# Patient Record
Sex: Female | Born: 1955 | Race: White | Hispanic: No | Marital: Married | Smoking: Never smoker
Health system: Southern US, Community
[De-identification: ages and names within clinical notes are randomized; demographics above are authoritative.]

## PROBLEM LIST (undated history)

## (undated) DIAGNOSIS — E7800 Pure hypercholesterolemia, unspecified: Secondary | ICD-10-CM

## (undated) DIAGNOSIS — R519 Headache, unspecified: Secondary | ICD-10-CM

## (undated) DIAGNOSIS — M1990 Unspecified osteoarthritis, unspecified site: Secondary | ICD-10-CM

## (undated) DIAGNOSIS — E039 Hypothyroidism, unspecified: Secondary | ICD-10-CM

## (undated) DIAGNOSIS — E079 Disorder of thyroid, unspecified: Secondary | ICD-10-CM

## (undated) DIAGNOSIS — G4730 Sleep apnea, unspecified: Secondary | ICD-10-CM

## (undated) DIAGNOSIS — I10 Essential (primary) hypertension: Secondary | ICD-10-CM

## (undated) DIAGNOSIS — E119 Type 2 diabetes mellitus without complications: Secondary | ICD-10-CM

## (undated) DIAGNOSIS — J40 Bronchitis, not specified as acute or chronic: Secondary | ICD-10-CM

## (undated) HISTORY — PX: COLONOSCOPY: SHX174

## (undated) HISTORY — PX: BACK SURGERY: SHX140

## (undated) HISTORY — PX: GALLBLADDER SURGERY: SHX652

## (undated) HISTORY — DX: Pure hypercholesterolemia, unspecified: E78.00

## (undated) HISTORY — PX: ABDOMINAL HYSTERECTOMY: SHX81

## (undated) HISTORY — PX: FOOT SURGERY: SHX648

## (undated) HISTORY — DX: Disorder of thyroid, unspecified: E07.9

## (undated) HISTORY — DX: Essential (primary) hypertension: I10

---

## 2000-01-03 ENCOUNTER — Encounter: Payer: Self-pay | Admitting: Orthopedic Surgery

## 2000-01-03 ENCOUNTER — Encounter: Admission: RE | Admit: 2000-01-03 | Discharge: 2000-01-03 | Payer: Self-pay | Admitting: Orthopedic Surgery

## 2000-02-20 ENCOUNTER — Encounter: Payer: Self-pay | Admitting: Neurosurgery

## 2000-02-22 ENCOUNTER — Encounter: Payer: Self-pay | Admitting: Neurosurgery

## 2000-02-22 ENCOUNTER — Ambulatory Visit (HOSPITAL_COMMUNITY): Admission: RE | Admit: 2000-02-22 | Discharge: 2000-02-23 | Payer: Self-pay | Admitting: Neurosurgery

## 2000-08-23 ENCOUNTER — Other Ambulatory Visit: Admission: RE | Admit: 2000-08-23 | Discharge: 2000-08-23 | Payer: Self-pay | Admitting: Obstetrics & Gynecology

## 2001-03-20 HISTORY — PX: BREAST CYST ASPIRATION: SHX578

## 2003-12-22 ENCOUNTER — Ambulatory Visit: Payer: Self-pay | Admitting: Family Medicine

## 2005-03-06 ENCOUNTER — Ambulatory Visit: Payer: Self-pay | Admitting: Family Medicine

## 2005-07-29 ENCOUNTER — Encounter: Admission: RE | Admit: 2005-07-29 | Discharge: 2005-07-29 | Payer: Self-pay | Admitting: Neurosurgery

## 2006-05-01 ENCOUNTER — Ambulatory Visit: Payer: Self-pay | Admitting: Family Medicine

## 2006-05-14 ENCOUNTER — Ambulatory Visit: Payer: Self-pay | Admitting: Family Medicine

## 2006-10-09 ENCOUNTER — Ambulatory Visit: Payer: Self-pay | Admitting: Endocrinology

## 2007-12-25 ENCOUNTER — Ambulatory Visit: Payer: Self-pay | Admitting: Family Medicine

## 2008-10-13 ENCOUNTER — Inpatient Hospital Stay: Payer: Self-pay | Admitting: Vascular Surgery

## 2009-03-04 ENCOUNTER — Ambulatory Visit: Payer: Self-pay | Admitting: Orthopedic Surgery

## 2009-03-11 ENCOUNTER — Ambulatory Visit: Payer: Self-pay | Admitting: Orthopedic Surgery

## 2009-07-14 ENCOUNTER — Ambulatory Visit: Payer: Self-pay | Admitting: Family Medicine

## 2010-08-17 ENCOUNTER — Ambulatory Visit: Payer: Self-pay | Admitting: Obstetrics and Gynecology

## 2010-10-10 ENCOUNTER — Ambulatory Visit: Payer: Self-pay | Admitting: Podiatry

## 2010-10-26 ENCOUNTER — Ambulatory Visit: Payer: Self-pay | Admitting: Gastroenterology

## 2010-12-04 ENCOUNTER — Emergency Department: Payer: Self-pay | Admitting: Emergency Medicine

## 2010-12-11 ENCOUNTER — Encounter: Payer: Self-pay | Admitting: Internal Medicine

## 2010-12-11 ENCOUNTER — Emergency Department (HOSPITAL_COMMUNITY): Payer: Managed Care, Other (non HMO)

## 2010-12-11 ENCOUNTER — Inpatient Hospital Stay (HOSPITAL_COMMUNITY)
Admission: EM | Admit: 2010-12-11 | Discharge: 2010-12-13 | DRG: 195 | Disposition: A | Discharge status: Home / Self Care | Payer: Managed Care, Other (non HMO) | Attending: Internal Medicine | Admitting: Internal Medicine

## 2010-12-11 DIAGNOSIS — E039 Hypothyroidism, unspecified: Secondary | ICD-10-CM | POA: Diagnosis present

## 2010-12-11 DIAGNOSIS — J189 Pneumonia, unspecified organism: Secondary | ICD-10-CM

## 2010-12-11 DIAGNOSIS — I10 Essential (primary) hypertension: Secondary | ICD-10-CM | POA: Diagnosis present

## 2010-12-11 DIAGNOSIS — F329 Major depressive disorder, single episode, unspecified: Secondary | ICD-10-CM | POA: Diagnosis present

## 2010-12-11 DIAGNOSIS — F3289 Other specified depressive episodes: Secondary | ICD-10-CM | POA: Diagnosis present

## 2010-12-11 DIAGNOSIS — K219 Gastro-esophageal reflux disease without esophagitis: Secondary | ICD-10-CM | POA: Diagnosis present

## 2010-12-11 LAB — CBC
HCT: 36.1 % (ref 36.0–46.0)
Hemoglobin: 12.2 g/dL (ref 12.0–15.0)
MCH: 28 pg (ref 26.0–34.0)
MCHC: 33.8 g/dL (ref 30.0–36.0)
MCV: 82.8 fL (ref 78.0–100.0)
Platelets: 295 K/uL (ref 150–400)
RBC: 4.36 MIL/uL (ref 3.87–5.11)
RDW: 13 % (ref 11.5–15.5)
WBC: 11 10*3/uL — ABNORMAL HIGH (ref 4.0–10.5)

## 2010-12-11 LAB — DIFFERENTIAL
Basophils Absolute: 0 10*3/uL (ref 0.0–0.1)
Basophils Relative: 0 % (ref 0–1)
Eosinophils Absolute: 0.1 10*3/uL (ref 0.0–0.7)
Eosinophils Relative: 1 % (ref 0–5)
Lymphocytes Relative: 17 % (ref 12–46)
Lymphs Abs: 1.8 10*3/uL (ref 0.7–4.0)
Monocytes Absolute: 0.6 10*3/uL (ref 0.1–1.0)
Monocytes Relative: 6 % (ref 3–12)
Neutro Abs: 8.4 10*3/uL — ABNORMAL HIGH (ref 1.7–7.7)
Neutrophils Relative %: 77 % (ref 43–77)

## 2010-12-11 LAB — BASIC METABOLIC PANEL WITH GFR
BUN: 13 mg/dL (ref 6–23)
CO2: 22 meq/L (ref 19–32)
Chloride: 103 meq/L (ref 96–112)
Creatinine, Ser: 0.66 mg/dL (ref 0.50–1.10)
Glucose, Bld: 105 mg/dL — ABNORMAL HIGH (ref 70–99)
Potassium: 3.8 meq/L (ref 3.5–5.1)

## 2010-12-11 LAB — POCT I-STAT TROPONIN I: Troponin i, poc: 0 ng/mL (ref 0.00–0.08)

## 2010-12-11 LAB — APTT: aPTT: 32 seconds (ref 24–37)

## 2010-12-11 LAB — PROTIME-INR
INR: 1.03 (ref 0.00–1.49)
Prothrombin Time: 13.7 seconds (ref 11.6–15.2)

## 2010-12-11 LAB — BASIC METABOLIC PANEL
Calcium: 9.8 mg/dL (ref 8.4–10.5)
GFR calc Af Amer: >60 mL/min (ref >60)
GFR calc non Af Amer: >60 mL/min (ref >60)
Sodium: 139 mEq/L (ref 135–145)

## 2010-12-11 LAB — D-DIMER, QUANTITATIVE: D-Dimer, Quant: 1.11 ug/mL-FEU — ABNORMAL HIGH (ref 0.00–0.48)

## 2010-12-11 NOTE — H&P (Signed)
Hospital Admission Note Date: 12/11/2010  Patient name: [name] Medical record number: [id number] Date of birth: 08-20-1955 Age: 55 y.o. Gender: female PCP: Gwynneth Albright, MD  Medical Service:  General Medicine Teaching B2  Attending physician:  Dr. Anderson Malta    Resident [id number]):  Dr. Carrolyn Meiers   Pager:  [phone] Resident (R1):  Dr. Genella Mech   Pager:  [phone]  Chief Complaint: Shortness of Breath  History of Present Illness: [name] is a 55 year old woman with history HTN, HLD, and recurrent bronchitis who underwent ankle surgery nine days ago who presents with shortness of breath.  She first had shortness of breath two days after her surgery.  She has concurrent chest pain worse with inspiration or exertion, no association with eating or position.    She presented to [facility] seven days ago for this SOB with concurrent chest pain.  There, she underwent workup that included negative cardiac enzymes, BNP 394, negative urinalysis, normal CXRay, and CT with contrast negative for PE and clear lungs.  Following discharge from the ED there, her shortness of breath and chest pain mildly improved but both persisted.    Yesterday, her shortness of breath became worse, and this is why she came to the hospital today.  She has been coughing up thick sputum, at first white but becoming yellow during her trip to the ED.  Her chest pain is similar to that described above, has the quality of "a pressure", is located centrally, and is non-radiating.  She reports recent fevers to 101F at home, but she has been afebrile here in the ED.  She has had night sweats as well.  At baseline, she tires before walking one block, but now she tires after walking less than 10 feet.  She has been walking with a walker some since surgery, but mostly she used a wheelchair since she gets so tired.  No leg pain, leg erythema, rash, headache, abdominal pain, diarrhea, dysuria, or  hematuria.  She has had some constipation but had a BM today and has constipation at baseline.      Meds: FIBERCON OTC, PO, Dose: 2 CAP, QAM LEVOTHROID (LEVOTHYROXINE) 137 MCG, PO, Dose: 1 TAB, DAILY  LISINOPRIL 10 MG, PO, Dose: 1 TAB, DAILY  NEXIUM (ESOMEPRAZOLE) 20 MG, PO, Dose: 1 CAP, DAILY  PRAMIPEXOLE 0.125MG  TAB, PO, Dose: 1 - 2 TAB, QHS  PROMETHAZINE 25 MG, PO, Dose: 1 TAB, Q8H, PRN   Allergies: NKDA   Past Medical History: HTN HLD Hypothyroidism Recurrent Bronchitis Depression   Past Surgical History: Recent Ankle Surgery Cholecystectomy Lumbar Back Surgery Hysterectomy C-Section   Family History: CAD    Social History: Never smoker.  Occasional alcohol use.  No drug use. At baseline, gets out of breath walking one block.   Review of Systems: See HPI   Physical Exam: Vitals: T 98.2, R 100, BP 138/66, R 17, SiO2 93-100% on 2L O2.   General: alert, well-developed, and cooperative to examination.  Head: normocephalic and atraumatic.  Eyes: vision grossly intact, pupils equal, pupils round, pupils reactive to light, no injection and anicteric.  Mouth: pharynx pink and moist, no erythema, and no exudates.  Neck: no JVD, and no carotid bruits.   Lungs: Mild crackles L base, normal breath sounds over R lung fields.  Normal respiratory effort, no accessory muscle use.  Heart: fast rate, regular rhythm, no murmur, no gallop, and no rub.  Abdomen: soft, non-tender, normal bowel sounds, no distention, no guarding, no  rebound tenderness. Msk: no joint swelling, no joint warmth, and no redness over joints.  Pulses: 2+ DP/PT pulses on R, L not palpated due to cast in place.  L toes well perfused.  Extremities: No cyanosis, clubbing, edema.  Cast in place over distal LLE.  No tenderness in R foot, calf, or thigh.  Tenderness to palpation in L popliteal region/upper calf.  No associated erythema, warmth, or swelling.   Neurologic: alert & oriented X3, cranial  nerves II-XII intact, strength normal in upper extremities, lower extremity strength not tested, sensation intact to light touch. Skin: turgor normal and no rashes.  Psych: Oriented X3, memory intact for recent and remote, normally interactive, good eye contact, not anxious appearing, and not depressed appearing.    Lab results: Basic Metabolic Panel:  Basename 12/11/10 1709  NA 139  K 3.8  CL 103  CO2 22  GLUCOSE 105*  BUN 13  CREATININE 0.66  CALCIUM 9.8  MG --  PHOS --   CBC:  Basename 12/11/10 1709  WBC 11.0*  NEUTROABS 8.4*  HGB 12.2  HCT 36.1  MCV 82.8  PLT 295    Troponin I, POC                          0.00              0.00-0.08        Ng/mL  Protime ( Prothrombin Time)              13.7              11.6-15.2        seconds  INR                                      1.03              0.00-1.49  PTT(a-Partial Thromboplastn Time)        32                24-37            seconds   Imaging results:  Dg Chest 2 View  12/11/2010  *RADIOLOGY REPORT*  Clinical Data: Shortness of breath, chest pain, fever and cough.  CHEST - 2 VIEW  Comparison: None.  Findings: Trachea is midline.  Heart size normal.  There is focal airspace consolidation in the right perihilar region.  Minimal bibasilar atelectasis. Tiny bilateral pleural effusions.  IMPRESSION:  1.  Focal airspace consolidation in the right perihilar region may be due to pneumonia.  Mass cannot be excluded.  Follow-up to clearing is recommended. 2.  Tiny bilateral pleural effusions. 3.  Bibasilar atelectasis.  Original Report Authenticated By: Reyes Ivan, M.D.    Other results: EKG: Rate 104, sinus rhythm.  QRS morphology abnormalities in III, aVL, V1, V2, V3, mostly new from previous 2001 EKG.  Q wave in III.  T wave inversion in lead III, present in 2001 as well.     Assessment & Plan by Problem:  Hospital Acquired Pneumonia: Given this patient's clinical picture of productive sputum, fever, elevated WBC  count and chest xray findings of right perihilar region consolidation PNA, this is most likely the reason for his symptoms. the patient is saturating > 93% on oxygen on 2L/[place].  Plan -Admit to Telemetry.  -Check 2 sets blood  cultures and check legionella antigen. -Start IV antibiotics for hospital acquired pneumonia with Vancomycin and Zosyn given recent ankle surgery 10 days ago.   Chest Pain: Patient presents with pleuritic chest pain, which has been ongoing for 6 days, worse with breathing, patient has not had similar symptoms in the past, has no history of CAD. Note that the patient has has left ankle surgery 10 days ago. Pt has no prior cath or echo on file. EKG today showed: incomplete right bundle branch block with no signs of acute MI, no new q waves detected. Given her presentation her chest pain is most likely due to PNA/MSK but may also be due to PE given recent surgery (wells score = 3 - Mod risk), and less likely ACS or GERD. Plan: -Will admit to TELE -Cycle CE x3 if Positive, consider anticoagulation vs catheterisation. -Check 2D Echo to r/o wall motion abnormalities. -Will consider cardiology consult if Troponins are positive. -Check D-Dimer, if positive consider CTA to ruleout PE.  -Risk stratify by checking AM fasting lipids, and controlling blood pressure with Lisinopril.   Hypertension: Well controlled. Patient is on Lisinopril at home. Will continue her home med and continue to monitor.  GERD: Will provide protonix while hospitalized and provide any OTC PPI as an outpatient.   DVT Prophylaxis: Lovenox       R2/3______________________________      R1________________________________  ATTENDING: I performed and/or observed a history and physical examination of the patient.  I discussed the case with the residents as noted and reviewed the residents' notes.  I agree with the findings and plan--please refer to the attending physician note for more  details.  Signature________________________________  Printed Name_____________________________

## 2010-12-12 LAB — CBC
HCT: 32 % — ABNORMAL LOW (ref 36.0–46.0)
MCHC: 33.8 g/dL (ref 30.0–36.0)
MCV: 82.9 fL (ref 78.0–100.0)
RDW: 13.4 % (ref 11.5–15.5)
WBC: 8.6 10*3/uL (ref 4.0–10.5)

## 2010-12-12 LAB — TROPONIN I: Troponin I: <0.3 ng/mL (ref <0.30)

## 2010-12-12 LAB — CARDIAC PANEL(CRET KIN+CKTOT+MB+TROPI)
Relative Index: INVALID (ref 0.0–2.5)
Relative Index: INVALID (ref 0.0–2.5)
Troponin I: <0.3 ng/mL (ref <0.30)

## 2010-12-12 LAB — LIPID PANEL
HDL: 39 mg/dL — ABNORMAL LOW (ref >39)
Total CHOL/HDL Ratio: 4.1 RATIO

## 2010-12-12 LAB — TSH: TSH: 0.821 u[IU]/mL (ref 0.350–4.500)

## 2010-12-12 LAB — BASIC METABOLIC PANEL
BUN: 14 mg/dL (ref 6–23)
Chloride: 100 mEq/L (ref 96–112)
Creatinine, Ser: 0.72 mg/dL (ref 0.50–1.10)
GFR calc Af Amer: >60 mL/min (ref >60)

## 2010-12-12 LAB — CK TOTAL AND CKMB (NOT AT ARMC)
CK, MB: 1.8 ng/mL (ref 0.3–4.0)
Relative Index: INVALID (ref 0.0–2.5)
Total CK: 61 U/L (ref 7–177)

## 2010-12-12 MED ORDER — IOHEXOL 300 MG/ML  SOLN
100.0000 mL | Freq: Once | INTRAMUSCULAR | Status: AC | PRN
  Administered 2010-12-12: 100 mL via INTRAVENOUS

## 2010-12-13 DIAGNOSIS — J189 Pneumonia, unspecified organism: Secondary | ICD-10-CM

## 2010-12-13 LAB — LEGIONELLA ANTIGEN, URINE: Legionella Antigen, Urine: NEGATIVE

## 2010-12-18 LAB — CULTURE, BLOOD (ROUTINE X 2)
Culture  Setup Time: 201209241021
Culture  Setup Time: 201209241021
Culture: NO GROWTH
Culture: NO GROWTH

## 2010-12-26 NOTE — Discharge Summary (Signed)
NAME:  [name], [name] NO.:  [id number]  MEDICAL RECORD NO.:  [id number]  LOCATION:  [facility]                         FACILITY:  [facility]  PHYSICIAN:  Dr. Phillips Odor            DATE OF BIRTH:  [birth year]  DATE OF ADMISSION:  12/11/2010 DATE OF DISCHARGE:  12/13/2010                              DISCHARGE SUMMARY   PRIMARY CARE DOCTOR:  Dr. Jerl Mina.  Their office number is [phone].  Their fax number is [phone].  DISCHARGE DIAGNOSES: 1. Healthcare-acquired pneumonia. 2. Hypertension. 3. Hypothyroidism. 4. Gastroesophageal reflux disease.  DISCHARGE MEDICATIONS: 1. Azithromycin 500 mg one tablet by mouth daily for 12 days, starting     day of discharge. 2. FiberCon OTC two tablets by mouth every evening. 3. Levothyroxine 137 mcg one tablet by mouth daily. 4. Lisinopril 10 mg one tablet by mouth daily. 5. Nexium 20 mg one capsule by mouth daily. 6. Pramipexole 0.125 mg one to two tablets by mouth daily at bedtime. 7. Promethazine 25 mg as needed for nausea one tablet every 8 hours.  DISPOSITION AND FOLLOWUP:  The patient will be seen with her PCP, Dr. Jerl Mina on October 1 at 11 o'clock in the morning.  At this time, please check on resolution of her symptoms as well as listen to her lungs and make sure that they are clear, and please continue to care for her other medical problems.  PROCEDURES PERFORMED:  None.  CONSULTATIONS:  None.  BRIEF ADMITTING HISTORY AND PHYSICAL:  This is a 55 year old woman with history of hypertension, hyperlipidemia, recurrent bronchitis, who underwent ankle surgery for an Achilles tendon 9 days ago, presents with shortness of breath, had shortness of breath 2 days after surgery, recurrent chest pain worse with inspiration or exertion.  No association with eating or position.  Presented to [facility] 7 days ago for shortness of breath with chest pain.  There she went workup that include negative cardiac  enzymes, BNP of 394, negative urinalysis, normal chest x-ray, and CT with contrast negative for PE and did show clear lungs. Following discharge there, her shortness of breath and chest pain improved, but did persist.  Yesterday, her shortness of breath became worse and she was coughing up 6 sputum at first white turning to yellow. Her chest pain similar as described above has the quality of pressure located centrally as nonradiating.  She does report fevers at home of 101 degrees Fahrenheit, but has been afebrile in the emergency department.  She has had night sweats at home at baseline.  She tired before walking one block now.  She tired after walking 10 feet.  She has been walking with walker since surgery, but mostly uses a wheelchair when she has been very tired.  She does not have any leg pain, erythema, rash, headache, abdominal pain, diarrhea, dysuria, or hematuria.  She has had some constipation.  The last bowel movement was today and have constipation at baseline.  HOME MEDICATIONS: 1. FiberCon OTC two tablets every morning by mouth. 2. Levothyroxine 137 mcg one tablet daily by mouth. 3. Lisinopril 10 mg p.o. one tablet daily. 4. Nexium 20 mg p.o. one capsule  daily. 5. Pramipexole 0.125 mg capsule by mouth one to two tablets as needed     at bedtime. 6. Promethazine 25 mg one tablet as needed every 8 hours orally for     nausea.  ALLERGIES:  None.  PAST MEDICAL HISTORY:  Hypertension, hyperlipidemia, hypothyroidism, recurrent bronchitis, and depression.  Recent surgical history, that is recent ankle surgery, cholecystectomy, lumbar back surgery, hysterectomy and oophorectomy.  PHYSICAL EXAMINATION:  VITAL SIGNS:  On the day of exam, temperature 98.2, respiration 17, pulse 100, blood pressure 138/66, saturating 93- 100% on 2 liters of oxygen. GENERAL:  Awake, alert, well developed, cooperative to exam, normocephalic, atraumatic. EYES:  Vision grossly intact.  Pupils are  equal, round, reactive to light, and accommodation.  Extraocular motions intact.  Anicteric. NECK:  No JVD.  No carotid bruits. LUNGS:  Mild crackles at the left base.  Normal breath sounds over the right lung fields.  Normal respiratory effort.  No accessory muscle use. HEART:  Fast rate, regular rhythm.  No murmurs, rubs, or gallops. ABDOMEN:  Soft, nontender, normal bowel sounds.  No distention.  No guarding.  No rebound tenderness. MUSCULOSKELETAL:  No joint swelling.  No joint warmth.  No redness over the joint.  Pulses 2+ dorsalis pedis and posterior tibial pulses on the right, left not palpated due to cath.  Left toe is well perfused. EXTREMITIES:  No cyanosis, clubbing, or edema.  Cath in place over distal left lower extremity.  No tenderness in right foot, calf, or thigh.  Tenderness to palpation in the left popliteal region of her calf.  No associated erythema, warmth, or swelling. NEUROLOGIC:  Awake, alert, and oriented x3.  Cranial nerves II through XII intact.  Strength normal in upper extremities and lower extremity. Strength not tested.  Sensation intact to light touch. SKIN:  Turgor normal.  No rashes. PSYCH:  Oriented x3, memory intact to remote and recent, normally interactive, good eye contact, not anxious appearing, not depressed appearing.  LABORATORY RESULTS:  On the day of admission, basic metabolic panel showing sodium 161, potassium 3.8, chloride 103, bicarb 22, BUN 13, creatinine 0.66, glucose 105, calcium 9.8.  CBC showing white count of 11.0, hemoglobin 12.2, platelets 295.  Point-of-care troponin was 0.00. PT/INR was normal.  PTT was normal.  There was a diagnostic chest x-ray that did show focal air space consolidation in right perihilar region may be due to pneumonia, mass cannot be excluded, follow up to show clearing as recommendation, tiny bilateral pleural effusions, bibasilar atelectasis.  There was also an EKG that was done that did show a rate of  104 sinus rhythm, QRS morphology abnormalities, 3 AVL, V1, V2, V3, mostly new from previous 2001.  EKG, Q-wave inversion in lead III present in 2001.  HOSPITAL COURSE BY PROBLEM: 1. Healthcare-acquired pneumonia given the patient's productive     sputum, fever, elevated white count, chest x-ray findings, this was     felt to be leading diagnosis.  The patient did have recent workup     that was negative for PE however.  An additional workup for chest     pain related ACS and PE was done that was negative via three     cardiac enzymes and another CT of the chest did confirm a right     perihilar pneumonia.  She was admitted to telemetry.  We did check     blood cultures.  We checked a Legionella strep influenza antigen.     Please start IV antibiotics  and we did get a repeat EKG that did     show no changes from baseline EKG on admission.  We did get a 2-D     echo and we did check a D-dimer which was positive which did lead     [place] to get the CT of the chest which was negative for PE.  She was     much improved at the time of discharge on IV antibiotics and was     switched to oral antibiotics at the time of discharge.  She was     felt to be stable to go home.  Her breathing was improved.  Her     functional status was approximately the same as her baseline since     the surgery.  She will need recuperation from the surgery in the     future.  Otherwise, stable to go home. 2. Hypertension.  She was well controlled during this hospitalization.     She is on lisinopril at home which was continued and did control     her blood pressure adequately during this hospitalization. 3. Hypotension.  We did check a TSH and free T4 levels which were both     normal and therefore we did continue her home dose of Synthroid and     we would make no changes at this time.  The patient was stable at     the time of discharge. 4. GERD.  We did provide Protonix while she was hospitalized and we     will  continue her Nexium as an outpatient.  No flares with this     during this hospitalization.  At this point, the patient was stable to be discharged from the hospital and was discharged home safely with an oral course of antibiotics and follow up with her primary care physician.  Additional vitals on the day of discharge are temperature was 98.2, pulse 82, respirations 22, blood pressure 124/77, saturating 96% on 2 liters.  She did have additional imaging that was done during this admission.  She did have a CT angio of the chest, which did show multifocal bilateral ground-glass opacities in the lungs.  Primary considerations include multifocal pneumonia, possibly fat embolism given history of recent orthopedic surgery, however, clinical correlation is recommended.  Small bilateral pleural effusions.  No evidence of acute traumatic pulmonary embolism, small hiatal hernia, several slightly prominent mediastinal lymph nodes of uncertain chronicity.  There was additionally an echo done that did show the left ventricle cavity size was normal, mild focal basal hypertrophy of the septum, systolic function rigorous, estimated ejection fraction 65-70%.  Wall motion normal.  No regional wall motion abnormalities. Doppler parameters were consistent with abnormal left ventricular relaxation, graded 1 of diastolic dysfunction, mitral valve had moderate regurgitation.  Left atrium mildly dilated.  Right ventricle mildly dilated.  Wall thickness was normal.  Right atrium was mildly dilated. Pulmonary artery systolic pressure was moderately increased.  PA peak pressure of 49 mmHg.  We did not have any historical echo to concern these findings to, however, there was a Legionella done that was negative.  There were two blood cultures drawn that did show no growth. Three cardiac enzymes that were all negative.  A TSH that was 0.82 which is normal and T4 free which was 1.25 which is normal.  A lipid  profile did show a total cholesterol of 159, triglyceride of 201, HDL of 39, LDL of 80.  A basic metabolic panel  on the day of discharge did show sodium 136, potassium 4.3, chloride 100, bicarb 28, BUN 14, creatinine 0.72, glucose 119, calcium 9.1.  CBC that did show white count of 8.6, hemoglobin 10.8, platelet count 265, and these were the only other laboratory results or radiology findings during this hospitalization. At this time, the patient was stable in regard to discharge.    ______________________________ Genella Mech, MD   ______________________________ Dr. Phillips Odor    EK/MEDQ  D:  12/13/2010  T:  12/13/2010  Job:  [id number]  cc:   Jerl Mina, MD  Electronically Signed by Genella Mech MD on 12/20/2010 07:23:15 PM Electronically Signed by Anderson Malta D.O. on 12/26/2010 04:56:30 PM

## 2012-06-24 ENCOUNTER — Ambulatory Visit: Payer: Self-pay | Admitting: Orthopedic Surgery

## 2013-03-14 ENCOUNTER — Encounter: Payer: Self-pay | Admitting: Podiatry

## 2013-03-14 ENCOUNTER — Ambulatory Visit (INDEPENDENT_AMBULATORY_CARE_PROVIDER_SITE_OTHER): Payer: BC Managed Care – PPO

## 2013-03-14 ENCOUNTER — Ambulatory Visit (INDEPENDENT_AMBULATORY_CARE_PROVIDER_SITE_OTHER): Payer: BC Managed Care – PPO | Admitting: Podiatry

## 2013-03-14 VITALS — BP 157/83 | HR 89 | Resp 16 | Ht 60.0 in | Wt 240.0 lb

## 2013-03-14 DIAGNOSIS — M79671 Pain in right foot: Secondary | ICD-10-CM

## 2013-03-14 DIAGNOSIS — M7731 Calcaneal spur, right foot: Secondary | ICD-10-CM

## 2013-03-14 DIAGNOSIS — M79609 Pain in unspecified limb: Secondary | ICD-10-CM

## 2013-03-14 DIAGNOSIS — M7660 Achilles tendinitis, unspecified leg: Secondary | ICD-10-CM

## 2013-03-14 DIAGNOSIS — M7730 Calcaneal spur, unspecified foot: Secondary | ICD-10-CM

## 2013-03-14 MED ORDER — TRIAMCINOLONE ACETONIDE 10 MG/ML IJ SUSP
10.0000 mg | Freq: Once | INTRAMUSCULAR | Status: AC
  Administered 2013-03-14: 10 mg

## 2013-03-14 MED ORDER — DICLOFENAC SODIUM 75 MG PO TBEC: 75.0000 mg | DELAYED_RELEASE_TABLET | Freq: Two times a day (BID) | ORAL | Status: DC

## 2013-03-14 NOTE — Progress Notes (Signed)
Subjective:     Patient ID: [name], female   DOB: Jan 19, 1956, 57 y.o.   MRN: [id number]  HPI patient states that she has been having trouble with her right Achilles tendon for several months. Had surgery by Dr. Charmian Muff on her left Achilles tendon approximately 2-1/2 years ago and that is doing well. Pain is worse with activity or prolonged standing   Review of Systems     Objective:   Physical Exam Neurovascular status intact with mild equinus condition noted of both feet. I noted there to be a well-healed incision site posterior aspect left heel and discomfort in the medial and central Achilles tendon at its insertion into the calcaneus right    Assessment:     Achilles tendinitis right with spur formation noted on x-ray    Plan:     We are going to try to initiate conservative care but it is possible that surgery will be necessary due to the fact the left one did not respond conservatively. I did a careful injection 3 mg Kenalog 5 of Xylocaine Marcaine mixture into the medial aspect of the Achilles first explaining to her the possibilities of rupture associated with injection treatment. Patient then had air fracture walker dispensed with instructions on usage and was given information on stretching exercises. Reappoint 3 weeks for Dr. Al Corpus to evaluate and also was placed on Voltaren 75 mg twice a day

## 2013-03-14 NOTE — Patient Instructions (Signed)

## 2013-03-31 ENCOUNTER — Ambulatory Visit: Payer: Self-pay | Admitting: Family Medicine

## 2013-04-03 ENCOUNTER — Ambulatory Visit: Payer: BC Managed Care – PPO | Admitting: Podiatry

## 2013-04-09 ENCOUNTER — Encounter: Payer: Self-pay | Admitting: Podiatry

## 2013-04-09 ENCOUNTER — Ambulatory Visit (INDEPENDENT_AMBULATORY_CARE_PROVIDER_SITE_OTHER): Payer: BC Managed Care – PPO | Admitting: Podiatry

## 2013-04-09 VITALS — BP 118/83 | HR 95 | Resp 16

## 2013-04-09 DIAGNOSIS — M7660 Achilles tendinitis, unspecified leg: Secondary | ICD-10-CM

## 2013-04-09 MED ORDER — DICLOFENAC SODIUM 75 MG PO TBEC: 75.0000 mg | DELAYED_RELEASE_TABLET | Freq: Two times a day (BID) | ORAL | Status: DC

## 2013-04-09 NOTE — Progress Notes (Signed)
Follow up of achilles right  Foot, it was better until last Friday when i got up from my desk. I also stopped where my Cam Walker 2 weeks ago. She ran out of her diclofenac and has not had a refill.  Objective: Vital signs are stable she is alert and oriented x3. She has mild edema to the posterior aspect of the right foot as the Achilles tendon attaches to the calcaneus. Very large nonpulsatile mass confirmed by radiograph as a retrocalcaneal heel spur. There is warm to the touch.  Assessment: Pain in limb secondary to tendo Achilles tendinitis.  Plan: Injected the posterior aspect of the right Achilles area today subcutaneously with 2 mg of dexamethasone and local anesthetic she's to continue to ice this continue the use of the diclofenac and get back into her cam walker. I will followup with her in 2-4 weeks consider MRI if no better.

## 2013-04-20 ENCOUNTER — Ambulatory Visit: Payer: Self-pay | Admitting: Family Medicine

## 2013-05-07 ENCOUNTER — Ambulatory Visit (INDEPENDENT_AMBULATORY_CARE_PROVIDER_SITE_OTHER): Payer: BC Managed Care – PPO | Admitting: Podiatry

## 2013-05-07 VITALS — BP 135/73 | HR 94 | Resp 16 | Ht 60.0 in | Wt 223.0 lb

## 2013-05-07 DIAGNOSIS — M7660 Achilles tendinitis, unspecified leg: Secondary | ICD-10-CM

## 2013-05-07 NOTE — Progress Notes (Signed)
She presents today for followup of her insertional Achilles tendinitis of her right foot. She states it is really not getting any better.  Objective: Vital signs are stable she is alert and oriented x3. There is pain on palpation at the insertion site of the tendo Achilles of the right heel. There is warmth on palpation to this area. She has pain with sharp dorsiflexion of the foot with the knee held straight.  Assessment: Can't rule out a partial split tear at the insertion site of the Achilles medially.  Plan: Discussed etiology pathology conservative versus surgical therapies at this point I suggested an MRI which she declined. I also suggested physical therapy and she declined this as well. I did write a prescription for physical therapy and case she decides she would like to attempt that. I will followup with her on an as-needed basis.

## 2013-05-18 ENCOUNTER — Ambulatory Visit: Payer: Self-pay | Admitting: Family Medicine

## 2014-04-29 ENCOUNTER — Ambulatory Visit: Payer: Self-pay | Admitting: Family Medicine

## 2015-04-02 ENCOUNTER — Emergency Department: Payer: Worker's Compensation

## 2015-04-02 ENCOUNTER — Encounter: Payer: Self-pay | Admitting: Emergency Medicine

## 2015-04-02 ENCOUNTER — Emergency Department
Admission: EM | Admit: 2015-04-02 | Discharge: 2015-04-02 | Disposition: A | Discharge status: Home / Self Care | Payer: Worker's Compensation | Attending: Emergency Medicine | Admitting: Emergency Medicine

## 2015-04-02 DIAGNOSIS — S61303A Unspecified open wound of left middle finger with damage to nail, initial encounter: Secondary | ICD-10-CM | POA: Insufficient documentation

## 2015-04-02 DIAGNOSIS — Z791 Long term (current) use of non-steroidal anti-inflammatories (NSAID): Secondary | ICD-10-CM | POA: Insufficient documentation

## 2015-04-02 DIAGNOSIS — S62633A Displaced fracture of distal phalanx of left middle finger, initial encounter for closed fracture: Secondary | ICD-10-CM

## 2015-04-02 DIAGNOSIS — S61309A Unspecified open wound of unspecified finger with damage to nail, initial encounter: Secondary | ICD-10-CM

## 2015-04-02 DIAGNOSIS — Y9289 Other specified places as the place of occurrence of the external cause: Secondary | ICD-10-CM | POA: Diagnosis not present

## 2015-04-02 DIAGNOSIS — Y9389 Activity, other specified: Secondary | ICD-10-CM | POA: Insufficient documentation

## 2015-04-02 DIAGNOSIS — S61313A Laceration without foreign body of left middle finger with damage to nail, initial encounter: Secondary | ICD-10-CM | POA: Insufficient documentation

## 2015-04-02 DIAGNOSIS — Y990 Civilian activity done for income or pay: Secondary | ICD-10-CM | POA: Insufficient documentation

## 2015-04-02 DIAGNOSIS — Z79899 Other long term (current) drug therapy: Secondary | ICD-10-CM | POA: Insufficient documentation

## 2015-04-02 DIAGNOSIS — W231XXA Caught, crushed, jammed, or pinched between stationary objects, initial encounter: Secondary | ICD-10-CM | POA: Insufficient documentation

## 2015-04-02 DIAGNOSIS — S6992XA Unspecified injury of left wrist, hand and finger(s), initial encounter: Secondary | ICD-10-CM | POA: Diagnosis present

## 2015-04-02 MED ORDER — OXYCODONE-ACETAMINOPHEN 5-325 MG PO TABS: 1.0000 | ORAL_TABLET | Freq: Four times a day (QID) | ORAL | Status: DC | PRN

## 2015-04-02 MED ORDER — DIAZEPAM 5 MG PO TABS
5.0000 mg | ORAL_TABLET | Freq: Once | ORAL | Status: AC
  Administered 2015-04-02: 5 mg via ORAL
  Filled 2015-04-02: qty 1

## 2015-04-02 MED ORDER — LIDOCAINE HCL (PF) 1 % IJ SOLN
10.0000 mL | Freq: Once | INTRAMUSCULAR | Status: DC
  Filled 2015-04-02: qty 10

## 2015-04-02 NOTE — Discharge Instructions (Signed)
Cast or Splint Care °Casts and splints support injured limbs and keep bones from moving while they heal. It is important to care for your cast or splint at home.   °HOME CARE INSTRUCTIONS °· Keep the cast or splint uncovered during the drying period. It can take 24 to 48 hours to dry if it is made of plaster. A fiberglass cast will dry in less than 1 hour. °· Do not rest the cast on anything harder than a pillow for the first 24 hours. °· Do not put weight on your injured limb or apply pressure to the cast until your health care provider gives you permission. °· Keep the cast or splint dry. Wet casts or splints can lose their shape and may not support the limb as well. A wet cast that has lost its shape can also create harmful pressure on your skin when it dries. Also, wet skin can become infected. °· Cover the cast or splint with a plastic bag when bathing or when out in the rain or snow. If the cast is on the trunk of the body, take sponge baths until the cast is removed. °· If your cast does become wet, dry it with a towel or a blow dryer on the cool setting only. °· Keep your cast or splint clean. Soiled casts may be wiped with a moistened cloth. °· Do not place any hard or soft foreign objects under your cast or splint, such as cotton, toilet paper, lotion, or powder. °· Do not try to scratch the skin under the cast with any object. The object could get stuck inside the cast. Also, scratching could lead to an infection. If itching is a problem, use a blow dryer on a cool setting to relieve discomfort. °· Do not trim or cut your cast or remove padding from inside of it. °· Exercise all joints next to the injury that are not immobilized by the cast or splint. For example, if you have a long leg cast, exercise the hip joint and toes. If you have an arm cast or splint, exercise the shoulder, elbow, thumb, and fingers. °· Elevate your injured arm or leg on 1 or 2 pillows for the first 1 to 3 days to decrease  swelling and pain. It is best if you can comfortably elevate your cast so it is higher than your heart. °SEEK MEDICAL CARE IF:  °· Your cast or splint cracks. °· Your cast or splint is too tight or too loose. °· You have unbearable itching inside the cast. °· Your cast becomes wet or develops a soft spot or area. °· You have a bad smell coming from inside your cast. °· You get an object stuck under your cast. °· Your skin around the cast becomes red or raw. °· You have new pain or worsening pain after the cast has been applied. °SEEK IMMEDIATE MEDICAL CARE IF:  °· You have fluid leaking through the cast. °· You are unable to move your fingers or toes. °· You have discolored (blue or white), cool, painful, or very swollen fingers or toes beyond the cast. °· You have tingling or numbness around the injured area. °· You have severe pain or pressure under the cast. °· You have any difficulty with your breathing or have shortness of breath. °· You have chest pain. °  °This information is not intended to replace advice given to you by your health care provider. Make sure you discuss any questions you have with your health care   provider.   Document Released: 03/03/2000 Document Revised: 12/25/2012 Document Reviewed: 09/12/2012 Elsevier Interactive Patient Education 2016 [employer]  Finger Fracture Fractures of fingers are breaks in the bones of the fingers. There are many types of fractures. There are different ways of treating these fractures. Your health care provider will discuss the best way to treat your fracture. CAUSES Traumatic injury is the main cause of broken fingers. These include:  Injuries while playing sports.  Workplace injuries.  Falls. RISK FACTORS Activities that can increase your risk of finger fractures include:  Sports.  Workplace activities that involve machinery.  A condition called osteoporosis, which can make your bones less dense and cause them to fracture more  easily. SIGNS AND SYMPTOMS The main symptoms of a broken finger are pain and swelling within 15 minutes after the injury. Other symptoms include:  Bruising of your finger.  Stiffness of your finger.  Numbness of your finger.  Exposed bones (compound fracture) if the fracture is severe. DIAGNOSIS  The best way to diagnose a broken bone is with X-ray imaging. Additionally, your health care provider will use this X-ray image to evaluate the position of the broken finger bones.  TREATMENT  Finger fractures can be treated with:   Nonreduction--This means the bones are in place. The finger is splinted without changing the positions of the bone pieces. The splint is usually left on for about a week to 10 days. This will depend on your fracture and what your health care provider thinks.  Closed reduction--The bones are put back into position without using surgery. The finger is then splinted.  Open reduction and internal fixation--The fracture site is opened. Then the bone pieces are fixed into place with pins or some type of hardware. This is seldom required. It depends on the severity of the fracture. HOME CARE INSTRUCTIONS   Follow your health care provider's instructions regarding activities, exercises, and physical therapy.  Only take over-the-counter or prescription medicines for pain, discomfort, or fever as directed by your health care provider. SEEK MEDICAL CARE IF: You have pain or swelling that limits the motion or use of your fingers. SEEK IMMEDIATE MEDICAL CARE IF:  Your finger becomes numb. MAKE SURE YOU:   Understand these instructions.  Will watch your condition.  Will get help right away if you are not doing well or get worse.   This information is not intended to replace advice given to you by your health care provider. Make sure you discuss any questions you have with your health care provider.   Document Released: 06/18/2000 Document Revised: 12/25/2012 Document  Reviewed: 10/16/2012 Elsevier Interactive Patient Education 2016 Else[employer] Inc.  Fingernail or Toenail Removal Fingernail or toenail removal is a surgical procedure to take off a nail from your finger or your toe. You may need to have a fingernail or toenail removed if it has an abnormal shape (deformity) or if it is severely injured. A fingernail or toenail may also be removed due to a bacterial infection, a severe ingrown toenail, or a fungal infection that has failed treatment with antifungal medicines. LET [facility] CARE PROVIDER KNOW ABOUT:  Any allergies you have.  All medicines you are taking, including vitamins, herbs, eye drops, creams, and over-the-counter medicines.  Previous problems you or members of your family have had with the use of anesthetics.  Any blood disorders you have.  Previous surgeries you have had.  Any medical conditions you may have. RISKS AND COMPLICATIONS Generally, this is a safe  procedure. However, problems may occur, including:  Pain.  Bleeding.  Infection.  Regrowth of a deformed nail. BEFORE THE PROCEDURE  Ask your health care provider about changing or stopping your regular medicines. This is especially important if you are taking diabetes medicines or blood thinners.  Follow instructions from your health care provider about eating or drinking restrictions.  Plan to have someone take you home after the procedure. PROCEDURE  An IV tube will be inserted into one of your veins.  You will be given one or more of the following:  A medicine that helps you relax (sedative).  A medicine that numbs the area (local anesthetic).  After your toe or finger is numb, your health care provider will insert a blunt instrument under your nail to lift it up.  In some cases, your health care provider may also make a cut (incision) in your nail.  After your nail is lifted away from your toe or finger, your health care provider will detach it from  your nail bed.  A germ-killing bandage (antiseptic dressing) will be put on your toe or finger. The procedure may vary among health care providers and hospitals. AFTER THE PROCEDURE  Your blood pressure, heart rate, breathing rate, and blood oxygen level will be monitored often until the medicines you were given have worn off.  It is common to have some pain after nail removal. You will be given pain medicine as needed.  You may be given a prescription for pain medicine and antibiotic medicine.  If you had a toenail removed, you will be given a surgical shoe to wear while you recover.  If you had a fingernail removed, you may be given a finger splint to wear while you recover.   This information is not intended to replace advice given to you by your health care provider. Make sure you discuss any questions you have with your health care provider.   Document Released: 12/03/2002 Document Revised: 07/21/2014 Document Reviewed: 03/04/2014 Elsevier Interactive Patient Education [employer] Else[employer] Inc.

## 2015-04-02 NOTE — ED Notes (Signed)
W/C injury from [place] , was [facility] with completion of  workman's comp protocol (urine drug screen and ETOH ) [facility] drsg to left hand 3rd digit , pt stated " [facility] be to the bone"

## 2015-04-02 NOTE — ED Provider Notes (Signed)
[facility] Emergency Department Provider Note  ____________________________________________  Time seen: Approximately 1:32 PM  I have reviewed the triage vital signs and the nursing notes.   HISTORY  Chief Complaint Finger Injury    HPI [name] is a 60 y.o. female who presents to the emergency department from work for an injury to the third digit left hand. Patient was [facility] sent [facility] for evaluation and treatment. Patient urine drug screen and EtOH screen was performed there but due to the nature of the wound the patient was sent to the emergency department for further evaluation. Patient states that she was using a binder pressed/punch when it caught the distal aspect of her third digit going through the nail. She states that she was able control bleeding prior to arrival. Patient does report significant amount of pain.   Past Medical History  Diagnosis Date  . Thyroid condition   . HBP (high blood pressure)   . High cholesterol     There are no active problems to display for this patient.   Past Surgical History  Procedure Laterality Date  . Foot surgery Left   . Back surgery    . Gallbladder surgery    . Cesarean section    . Abdominal hysterectomy      Current Outpatient Rx  Name  Route  Sig  Dispense  Refill  . citalopram (CELEXA) 20 MG tablet   Oral   Take 20 mg by mouth daily.         . diclofenac (VOLTAREN) 75 MG EC tablet   Oral   Take 1 tablet (75 mg total) by mouth 2 (two) times daily.   60 tablet   0   . lisinopril (PRINIVIL,ZESTRIL) 10 MG tablet   Oral   Take 10 mg by mouth daily.         Marland Kitchen. oxyCODONE-acetaminophen (ROXICET) 5-325 MG tablet   Oral   Take 1 tablet by mouth every 6 (six) hours as needed for severe pain.   20 tablet   0   . pramipexole (MIRAPEX) 0.125 MG tablet   Oral   Take 0.125 mg by mouth daily.         . rosuvastatin (CRESTOR) 10 MG tablet   Oral    Take 10 mg by mouth daily.           Allergies Review of patient's allergies indicates no known allergies.  No family history on file.  Social History Social History  Substance Use Topics  . Smoking status: Never Smoker   . Smokeless tobacco: Never Used  . Alcohol Use: Yes     Comment: occasionally     Review of Systems  Constitutional: No fever/chills Eyes: No visual changes. No discharge ENT: No sore throat. Cardiovascular: no chest pain. Respiratory: no cough. No SOB. Gastrointestinal: No abdominal pain.  No nausea, no vomiting.  No diarrhea.  No constipation. Genitourinary: Negative for dysuria. No hematuria Musculoskeletal: Negative for back pain. Skin: Negative for rash. Positive for laceration through the nail bed of the third digit left hand. Neurological: Negative for headaches, focal weakness or numbness. 10-point ROS otherwise negative.  ____________________________________________   PHYSICAL EXAM:  VITAL SIGNS: ED Triage Vitals  Enc Vitals Group     BP 04/02/15 1316 163/83 mmHg     Pulse Rate 04/02/15 1316 97     Resp 04/02/15 1316 18     Temp 04/02/15 1316 98.5 F (36.9 C)  Temp Source 04/02/15 1316 Oral     SpO2 04/02/15 1316 100 %     Weight 04/02/15 1316 230 lb (104.327 kg)     Height 04/02/15 1316 5' (1.524 m)     Head Cir --      Peak Flow --      Pain Score 04/02/15 1316 6     Pain Loc --      Pain Edu? --      Excl. in GC? --      Constitutional: Alert and oriented. Well appearing and in no acute distress. Eyes: Conjunctivae are normal. PERRL. EOMI. Head: Atraumatic. ENT:      Ears:       Nose: No congestion/rhinnorhea.      Mouth/Throat: Mucous membranes are moist.  Neck: No stridor.   Hematological/Lymphatic/Immunilogical: No cervical lymphadenopathy. Cardiovascular: Normal rate, regular rhythm. Normal S1 and S2.  Good peripheral circulation. Respiratory: Normal respiratory effort without tachypnea or retractions. Lungs  CTAB. Gastrointestinal: Soft and nontender. No distention. No CVA tenderness. Musculoskeletal: No lower extremity tenderness nor edema.  No joint effusions. Neurologic:  Normal speech and language. No gross focal neurologic deficits are appreciated.  Skin:  Skin is warm, dry and intact. No rash noted. Laceration noted to the distal third digit left hand. Bleeding is controlled at this time. No visible foreign body at this time. Laceration is through the nail. It is approximately 2 cm in length. No visible deformity to finger. Sensation is intact distal finger. Psychiatric: Mood and affect are normal. Speech and behavior are normal. Patient exhibits appropriate insight and judgement.   ____________________________________________   LABS (all labs ordered are listed, but only abnormal results are displayed)  Labs Reviewed - No data to display ____________________________________________  EKG   ____________________________________________  RADIOLOGY Festus Barren Cuthriell, personally viewed and evaluated these images (plain radiographs) as part of my medical decision making, as well as reviewing the written report by the radiologist.  Left middle finger x-ray Impression: Mild comminuted fracture at the tip of the distal phalanx with associated soft tissue injury. ____________________________________________    PROCEDURES  Procedure(s) performed:    LACERATION REPAIR Performed by: Racheal Patches Authorized by: Delorise Royals Cuthriell Consent: Verbal consent obtained. Risks and benefits: risks, benefits and alternatives were discussed Consent given by: patient Patient identity confirmed: provided demographic data Prepped and Draped in normal sterile fashion Wound explored  Laceration Location: Distal third digit left hand  Laceration Length: 2 cm  No Foreign Bodies seen or palpated  Anesthesia: Digital block   Local anesthetic: lidocaine 1 % without  epinephrine  Anesthetic total: 7 ml  Irrigation method: syringe Amount of cleaning: standard  Technique: Distal nail was removed using forceps. No bed was elevated using scissors and tissue was stabilized using tweezers while nail was lifted from nailbed using forceps. Bleeding was controlled using direct pressure. Area was thoroughly irrigated and cleansed. Surgicel was applied to prevent continued bleeding. Finger was dressed and splinted.   Patient tolerance: Patient tolerated the procedure well with no immediate complications.    Medications  diazepam (VALIUM) tablet 5 mg (5 mg Oral Given 04/02/15 1536)     ____________________________________________   INITIAL IMPRESSION / ASSESSMENT AND PLAN / ED COURSE  Pertinent labs & imaging results that were available during my care of the patient were reviewed by me and considered in my medical decision making (see chart for details).  Patient's diagnosis is consistent with distal phalanx fracture with laceration to the nailbed.  Distal aspect of the nail was removed and area was packed with Surgicel, dress, and finger was splinted.. Patient will be discharged home with prescriptions for pain medication. Patient is to follow up with orthopedic surgeon for further evaluation and treatment. Patient is given ED precautions to return to the ED for any worsening or new symptoms.     ____________________________________________  FINAL CLINICAL IMPRESSION(S) / ED DIAGNOSES  Final diagnoses:  Closed fracture of distal phalanx of third finger of left hand, initial encounter  Fingernail avulsion, partial, initial encounter      NEW MEDICATIONS STARTED DURING THIS VISIT:  Discharge Medication List as of 04/02/2015  4:42 PM    START taking these medications   Details  oxyCODONE-acetaminophen (ROXICET) 5-325 MG tablet Take 1 tablet by mouth every 6 (six) hours as needed for severe pain., Starting 04/02/2015, Until Discontinued, Print             Racheal Patches, PA-C 04/04/15 1203  Sharman Cheek, MD 04/05/15 [id number]

## 2015-07-07 ENCOUNTER — Other Ambulatory Visit: Payer: Self-pay | Admitting: Family Medicine

## 2015-07-07 DIAGNOSIS — Z1231 Encounter for screening mammogram for malignant neoplasm of breast: Secondary | ICD-10-CM

## 2015-07-15 ENCOUNTER — Ambulatory Visit
Admission: RE | Admit: 2015-07-15 | Discharge: 2015-07-15 | Disposition: A | Discharge status: Home / Self Care | Payer: 59 | Source: Ambulatory Visit | Attending: Family Medicine | Admitting: Family Medicine

## 2015-07-15 DIAGNOSIS — Z1231 Encounter for screening mammogram for malignant neoplasm of breast: Secondary | ICD-10-CM | POA: Insufficient documentation

## 2016-01-24 ENCOUNTER — Ambulatory Visit (INDEPENDENT_AMBULATORY_CARE_PROVIDER_SITE_OTHER): Payer: BLUE CROSS/BLUE SHIELD

## 2016-01-24 ENCOUNTER — Ambulatory Visit (INDEPENDENT_AMBULATORY_CARE_PROVIDER_SITE_OTHER): Payer: 59 | Admitting: Podiatry

## 2016-01-24 VITALS — BP 140/79 | HR 83 | Resp 16

## 2016-01-24 DIAGNOSIS — G5762 Lesion of plantar nerve, left lower limb: Secondary | ICD-10-CM

## 2016-01-24 DIAGNOSIS — M79672 Pain in left foot: Secondary | ICD-10-CM

## 2016-01-24 NOTE — Progress Notes (Signed)
She presents today with a week duration of pain to the plantar lateral aspect of the left foot. She states it is a stabbing pain seems to be worse with standing or walking nothing seems to be relieving it.  Objective: Vital signs are stable she is alert and oriented 3 pulses are palpable. Neurologic sensorium is intact palpable Mulder's click to the third interdigital space of the left foot is also noted. Radiographs today taken do not demonstrate any major osseous abnormalities in this area. No fractures are identified.  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  Plan: I injected that area today with Kenalog and local anesthetic. Discussed appropriate shoe gear stretching exercises ice therapy and shoe modifications. Will follow up with her in 1 month. We did discuss a possible knee for alcohol injections.

## 2016-02-23 ENCOUNTER — Encounter: Payer: Self-pay | Admitting: Podiatry

## 2016-02-23 ENCOUNTER — Ambulatory Visit (INDEPENDENT_AMBULATORY_CARE_PROVIDER_SITE_OTHER): Payer: BLUE CROSS/BLUE SHIELD | Admitting: Podiatry

## 2016-02-23 DIAGNOSIS — G5762 Lesion of plantar nerve, left lower limb: Secondary | ICD-10-CM

## 2016-02-23 DIAGNOSIS — M722 Plantar fascial fibromatosis: Secondary | ICD-10-CM | POA: Diagnosis not present

## 2016-02-23 NOTE — Progress Notes (Signed)
She presents today for follow-up of neuroma to the left foot. She states that it really hasn't bothered her lately but she started having pain along the lateral side of the left foot.  Objective: Vital signs are stable she is alert and oriented 3 no palpable Mulder's click. Pulses remain palpable. She is on palpation medial calcaneal tubercle of the left heel.  Assessment: Plantar fasciitis left heel. 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  Plan: Reinjected the left heel today with Kenalog and local anesthetic follow-up with her in 1 month necessary. Also dispensed a plantar fascia brace.

## 2016-03-29 ENCOUNTER — Ambulatory Visit: Payer: BLUE CROSS/BLUE SHIELD | Admitting: Podiatry

## 2016-04-05 ENCOUNTER — Emergency Department: Payer: BLUE CROSS/BLUE SHIELD

## 2016-04-05 ENCOUNTER — Emergency Department
Admission: EM | Admit: 2016-04-05 | Discharge: 2016-04-05 | Disposition: A | Discharge status: Home / Self Care | Payer: BLUE CROSS/BLUE SHIELD | Attending: Emergency Medicine | Admitting: Emergency Medicine

## 2016-04-05 ENCOUNTER — Encounter: Payer: Self-pay | Admitting: Emergency Medicine

## 2016-04-05 DIAGNOSIS — Z79899 Other long term (current) drug therapy: Secondary | ICD-10-CM | POA: Diagnosis not present

## 2016-04-05 DIAGNOSIS — J09X2 Influenza due to identified novel influenza A virus with other respiratory manifestations: Secondary | ICD-10-CM | POA: Insufficient documentation

## 2016-04-05 DIAGNOSIS — J101 Influenza due to other identified influenza virus with other respiratory manifestations: Secondary | ICD-10-CM

## 2016-04-05 DIAGNOSIS — R05 Cough: Secondary | ICD-10-CM | POA: Diagnosis present

## 2016-04-05 LAB — INFLUENZA PANEL BY PCR (TYPE A & B)
Influenza A By PCR: POSITIVE — AB
Influenza B By PCR: NEGATIVE

## 2016-04-05 MED ORDER — OSELTAMIVIR PHOSPHATE 75 MG PO CAPS: 75.0000 mg | ORAL_CAPSULE | Freq: Two times a day (BID) | ORAL | 0 refills | Status: AC

## 2016-04-05 MED ORDER — HYDROCOD POLST-CPM POLST ER 10-8 MG/5ML PO SUER: 5.0000 mL | Freq: Two times a day (BID) | ORAL | 0 refills | Status: AC | PRN

## 2016-04-05 MED ORDER — ACETAMINOPHEN 500 MG PO TABS
ORAL_TABLET | ORAL | Status: AC
  Administered 2016-04-05: 1000 mg via ORAL
  Filled 2016-04-05: qty 2

## 2016-04-05 MED ORDER — ACETAMINOPHEN 500 MG PO TABS
1000.0000 mg | ORAL_TABLET | Freq: Once | ORAL | Status: AC
  Administered 2016-04-05: 1000 mg via ORAL

## 2016-04-05 NOTE — ED Triage Notes (Signed)
Pt with cough and states grandson has the flu. Also states a lot of chest and head congestion.

## 2016-04-05 NOTE — ED Provider Notes (Signed)
[facility] Emergency Department Provider Note  ____________________________________________  Time seen: Approximately 9:35 AM  I have reviewed the triage vital signs and the nursing notes.   HISTORY  Chief Complaint Cough    HPI [name] is a 61 y.o. female presenting to the emergency department with productive cough, fatigue and dyspnea that started on Saturday. Additional symptoms include headache, rhinorrhea and congestion. Patient states that she has not evaluated her temperature but she had chills last night. Patient is a nonsmoker. She has a history of pneumonia and recurrent bronchitis. Patient denies chest pain, chest tightness, abdominal pain, nausea, vomiting and diarrhea. Patient has a grandson who was just diagnosed with influenza. Patient has taken Tylenol and Tessalon Perles but has attempted no other alleviating factors. She has diminished appetite and energy. She is staying hydrated.  Past Medical History:  Diagnosis Date  . HBP (high blood pressure)   . High cholesterol   . Thyroid condition     There are no active problems to display for this patient.   Past Surgical History:  Procedure Laterality Date  . ABDOMINAL HYSTERECTOMY    . BACK SURGERY    . BREAST CYST ASPIRATION Left 2003   neg  . CESAREAN SECTION    . FOOT SURGERY Left   . GALLBLADDER SURGERY      Prior to Admission medications   Medication Sig Start Date End Date Taking? Authorizing Provider  buPROPion (WELLBUTRIN XL) 150 MG 24 hr tablet Take 150 mg by mouth.  07/16/15 07/15/16 Yes Historical Provider, MD  citalopram (CELEXA) 20 MG tablet Take 20 mg by mouth daily.   Yes Historical Provider, MD  ibuprofen (ADVIL,MOTRIN) 200 MG tablet Take 200 mg by mouth every 6 (six) hours as needed.   Yes Historical Provider, MD  levothyroxine (SYNTHROID, LEVOTHROID) 150 MCG tablet Take 150 mcg by mouth every morning.  07/16/15  Yes Historical Provider, MD  lisinopril  (PRINIVIL,ZESTRIL) 10 MG tablet Take 10 mg by mouth daily.   Yes Historical Provider, MD  naproxen sodium (ALEVE) 220 MG tablet Take 220 mg by mouth 2 (two) times daily with a meal.   Yes Historical Provider, MD  rosuvastatin (CRESTOR) 10 MG tablet Take 10 mg by mouth daily.   Yes Historical Provider, MD  chlorpheniramine-HYDROcodone (TUSSIONEX PENNKINETIC ER) 10-8 MG/5ML SUER Take 5 mLs by mouth 2 (two) times daily as needed for cough. 04/05/16 04/12/16  Orvil FeilJaclyn M Tino Ronan, PA-C  oseltamivir (TAMIFLU) 75 MG capsule Take 1 capsule (75 mg total) by mouth 2 (two) times daily. 04/05/16 04/10/16  Orvil FeilJaclyn M Chala Gul, PA-C    Allergies Patient has no known allergies.  Family History  Problem Relation Age of Onset  . Breast cancer Maternal Aunt [id number]    Social History Social History  Substance Use Topics  . Smoking status: Never Smoker  . Smokeless tobacco: Never Used  . Alcohol use Yes     Comment: occasionally     Review of Systems  Constitutional: Has had fatigue Eyes: No visual changes. No discharge ENT: Has congestion and rhinorrhea Cardiovascular: no chest pain. Respiratory: Has productive cough and dyspnea Gastrointestinal: No abdominal pain.  No nausea, no vomiting.  No diarrhea.  No constipation. Musculoskeletal: Negative for musculoskeletal pain. Skin: Negative for rash, abrasions, lacerations, ecchymosis. Neurological: Positive for headaches, no focal weakness or numbness. ____________________________________________   PHYSICAL EXAM:  VITAL SIGNS: ED Triage Vitals  Enc Vitals Group     BP 04/05/16 0828 (!) 151/77  Pulse Rate 04/05/16 0828 (!) 106     Resp 04/05/16 0828 18     Temp 04/05/16 0828 98.8 F (37.1 C)     Temp Source 04/05/16 0828 Oral     SpO2 04/05/16 0828 97 %     Weight 04/05/16 0829 232 lb (105.2 kg)     Height 04/05/16 0829 5' (1.524 m)     Head Circumference --      Peak Flow --      Pain Score 04/05/16 0832 9     Pain Loc --      Pain Edu? --       Excl. in GC? --    Constitutional: Alert and oriented. Patient is lying supine in bed.  Eyes: Conjunctivae are normal. PERRL. EOMI. Head: Atraumatic. ENT:      Ears: Tympanic membranes are injected bilaterally without evidence of effusion or purulent exudate. Bony landmarks are visualized bilaterally. No pain with palpation at the tragus.      Nose: Nasal turbinates are edematous and erythematous. Trace rhinorrhea visualized.      Mouth/Throat: Mucous membranes are moist. Posterior pharynx is mildly erythematous. No tonsillar hypertrophy or purulent exudate. Uvula is midline. Neck: Full range of motion. No pain is elicited with flexion at the neck. Hematological/Lymphatic/Immunilogical: No cervical lymphadenopathy. Cardiovascular: Normal rate, regular rhythm. Normal S1 and S2.  Good peripheral circulation. Respiratory: Patient coughs throughout respiratory exam. Normal respiratory effort without tachypnea or retractions. Lungs CTAB. Good air entry to the bases with no decreased or absent breath sounds. Gastrointestinal: Bowel sounds 4 quadrants. Soft and nontender to palpation. No guarding or rigidity. No palpable masses. No distention. No CVA tenderness.  Skin:  Skin is warm, dry and intact. No rash noted. Psychiatric: Mood and affect are normal. Speech and behavior are normal. Patient exhibits appropriate insight and judgement.   ____________________________________________   LABS (all labs ordered are listed, but only abnormal results are displayed)  Labs Reviewed  INFLUENZA PANEL BY PCR (TYPE A & B) - Abnormal; Notable for the following:       Result Value   Influenza A By PCR POSITIVE (*)    All other components within normal limits   ____________________________________________  EKG   ____________________________________________  RADIOLOGY  Orvil Feil, personally viewed and evaluated these images (plain radiographs) as part of my medical decision making, as well as  reviewing the written report by the radiologist.  Dg Chest 2 View  Result Date: 04/05/2016 CLINICAL DATA:  Productive cough with fever and shortness of breath. EXAM: CHEST  2 VIEW COMPARISON:  12/04/2010 FINDINGS: The heart size and mediastinal contours are within normal limits. Both lungs are clear. The visualized skeletal structures are unremarkable. IMPRESSION: No active cardiopulmonary disease. Electronically Signed   By: Richarda Overlie M.D.   On: 04/05/2016 09:59    ____________________________________________    PROCEDURES  Procedure(s) performed:    Procedures    Medications  acetaminophen (TYLENOL) tablet 1,000 mg (1,000 mg Oral Given 04/05/16 1114)     ____________________________________________   INITIAL IMPRESSION / ASSESSMENT AND PLAN / ED COURSE  Pertinent labs & imaging results that were available during my care of the patient were reviewed by me and considered in my medical decision making (see chart for details).  Review of the [place] CSRS was performed in accordance of the [facility] prior to dispensing any controlled drugs.    assessment and plan:  Influenza Patient presents to the emergency department with productive cough, dyspnea, headache, congestion  and rhinorrhea. Patient tested positive for influenza A in the emergency department. DG chest revealed no consolidations or findings consistent with pneumonia. Physical exam and vitals are reassuring aside from fever. Patient was given Tylenol prior to discharge. Patient education was provided regarding the course of influenza. She was discharged with Tamiflu and Tussionex for cough. Patient was advised to follow-up with her primary care provider in one week. All patient questions were answered. ____________________________________________  FINAL CLINICAL IMPRESSION(S) / ED DIAGNOSES  Final diagnoses:  Influenza A      NEW MEDICATIONS STARTED DURING THIS VISIT:  Discharge Medication List as of 04/05/2016 10:35 AM     START taking these medications   Details  chlorpheniramine-HYDROcodone (TUSSIONEX PENNKINETIC ER) 10-8 MG/5ML SUER Take 5 mLs by mouth 2 (two) times daily as needed for cough., Starting Wed 04/05/2016, Until Wed 04/12/2016, Print    oseltamivir (TAMIFLU) 75 MG capsule Take 1 capsule (75 mg total) by mouth 2 (two) times daily., Starting Wed 04/05/2016, Until Mon 04/10/2016, Print            This chart was dictated using voice recognition software/Dragon. Despite best efforts to proofread, errors can occur which can change the meaning. Any change was purely unintentional.    Orvil Feil, PA-C 04/06/16 4098    Emily Filbert, MD 04/06/16 [phone]

## 2016-06-14 ENCOUNTER — Other Ambulatory Visit: Payer: Self-pay | Admitting: Nurse Practitioner

## 2016-10-06 ENCOUNTER — Other Ambulatory Visit: Payer: Self-pay | Admitting: Family Medicine

## 2016-10-06 DIAGNOSIS — Z1231 Encounter for screening mammogram for malignant neoplasm of breast: Secondary | ICD-10-CM

## 2016-10-23 ENCOUNTER — Ambulatory Visit
Admission: RE | Admit: 2016-10-23 | Discharge: 2016-10-23 | Disposition: A | Discharge status: Home / Self Care | Payer: BLUE CROSS/BLUE SHIELD | Source: Ambulatory Visit | Attending: Family Medicine | Admitting: Family Medicine

## 2016-10-23 DIAGNOSIS — Z1231 Encounter for screening mammogram for malignant neoplasm of breast: Secondary | ICD-10-CM | POA: Insufficient documentation

## 2017-02-25 IMAGING — CR DG CHEST 2V
2 series · 2 of 2 positions shown · non-contrast
Comparison: 12/04/2010

CLINICAL DATA: Productive cough with fever and shortness of breath.

EXAM:
CHEST  2 VIEW

[chest pa]
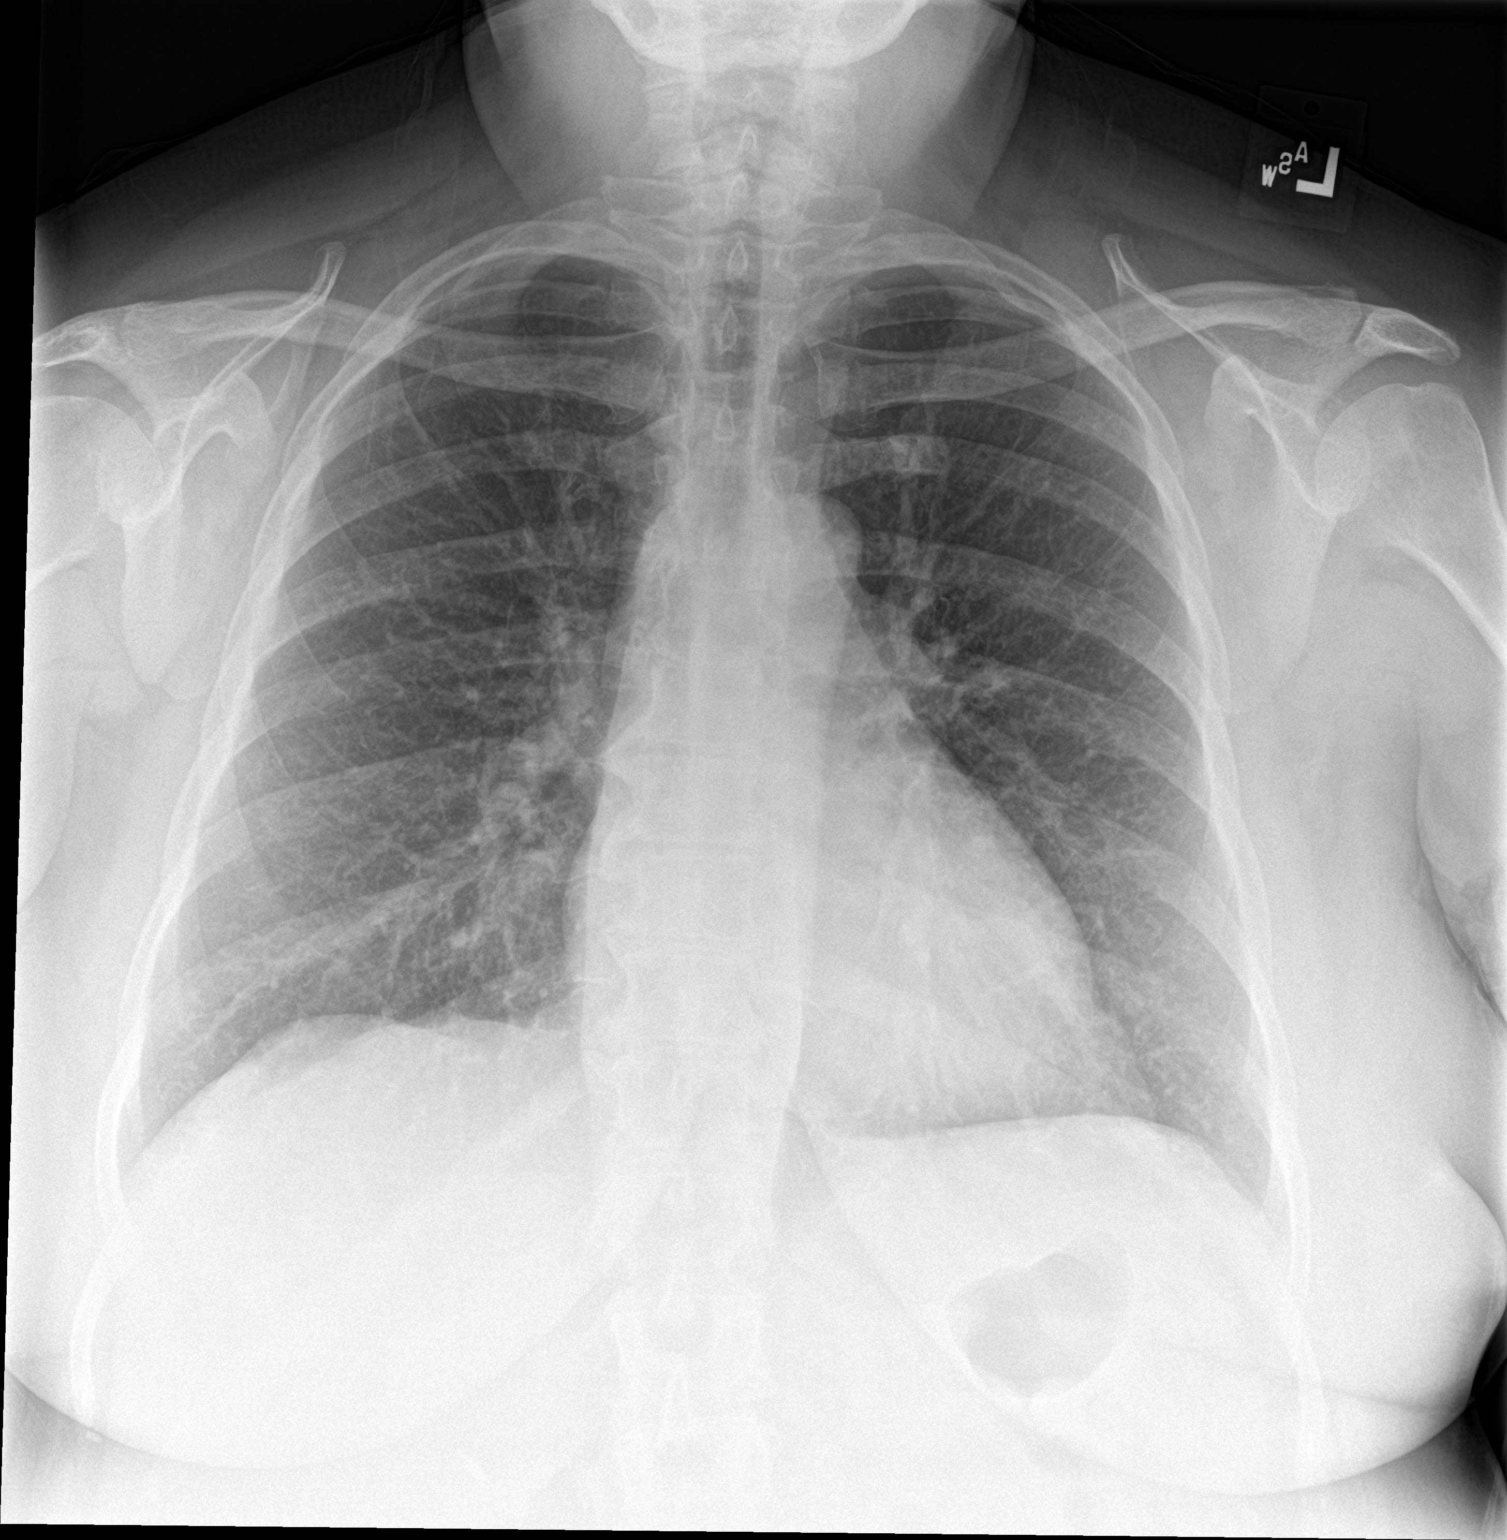

[chest lat]
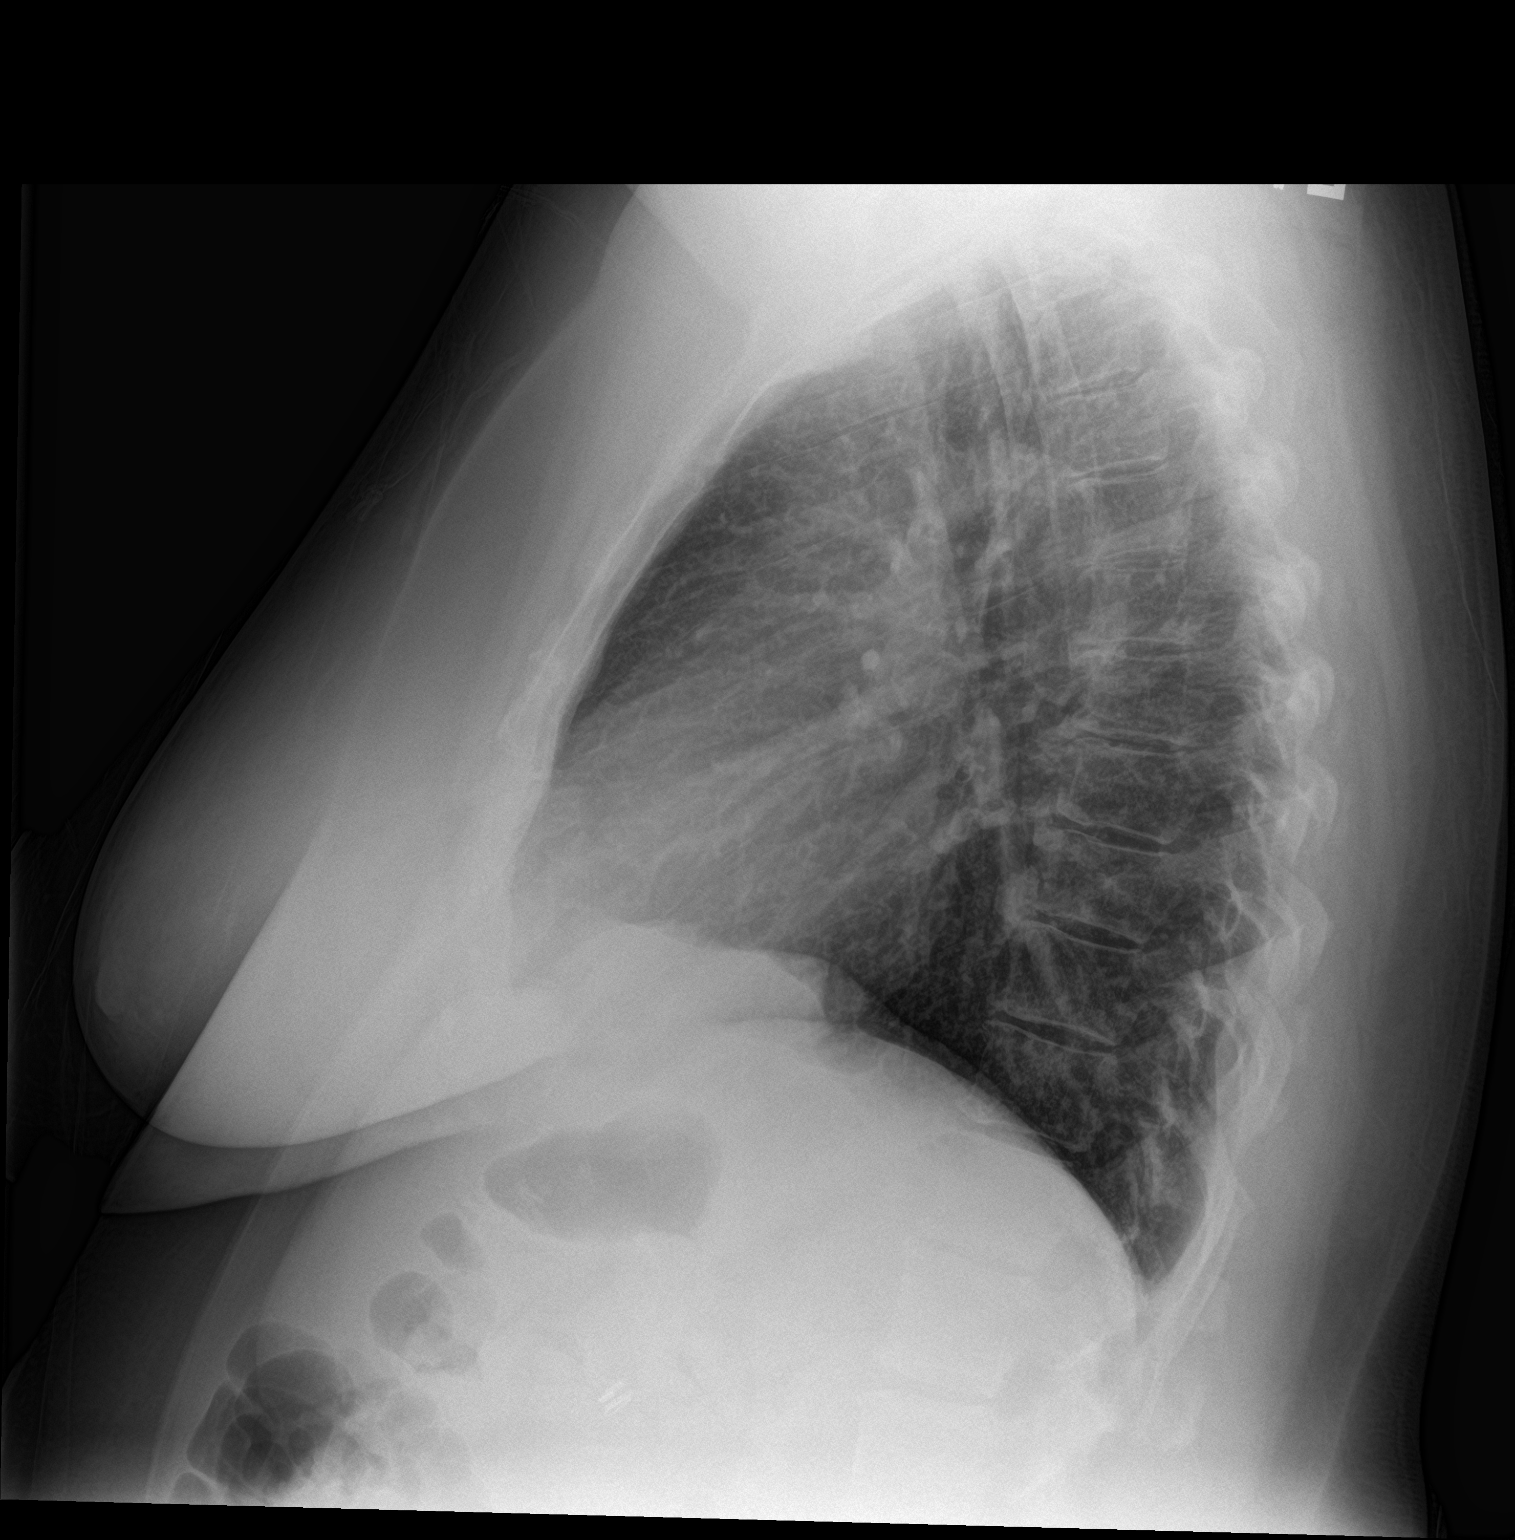

[2 of 2 positions shown; findings below may reference images not displayed]

FINDINGS: The heart size and mediastinal contours are within normal limits.
Both lungs are clear. The visualized skeletal structures are
unremarkable.
IMPRESSION: No active cardiopulmonary disease.

## 2018-01-07 ENCOUNTER — Other Ambulatory Visit: Payer: Self-pay | Admitting: Nurse Practitioner

## 2018-01-07 DIAGNOSIS — Z1231 Encounter for screening mammogram for malignant neoplasm of breast: Secondary | ICD-10-CM

## 2018-02-04 ENCOUNTER — Ambulatory Visit
Admission: RE | Admit: 2018-02-04 | Discharge: 2018-02-04 | Disposition: A | Discharge status: Home / Self Care | Payer: 59 | Source: Ambulatory Visit | Attending: Nurse Practitioner | Admitting: Nurse Practitioner

## 2018-02-04 DIAGNOSIS — Z1231 Encounter for screening mammogram for malignant neoplasm of breast: Secondary | ICD-10-CM | POA: Insufficient documentation

## 2019-04-24 ENCOUNTER — Ambulatory Visit: Payer: 59 | Attending: Internal Medicine

## 2019-04-24 DIAGNOSIS — Z20822 Contact with and (suspected) exposure to covid-19: Secondary | ICD-10-CM

## 2019-04-25 LAB — NOVEL CORONAVIRUS, NAA: SARS-CoV-2, NAA: NOT DETECTED

## 2019-05-12 ENCOUNTER — Other Ambulatory Visit: Payer: Self-pay | Admitting: Family

## 2019-05-12 DIAGNOSIS — Z1231 Encounter for screening mammogram for malignant neoplasm of breast: Secondary | ICD-10-CM

## 2019-05-31 ENCOUNTER — Ambulatory Visit: Payer: 59 | Attending: Internal Medicine

## 2019-05-31 DIAGNOSIS — Z23 Encounter for immunization: Secondary | ICD-10-CM

## 2019-05-31 NOTE — Progress Notes (Signed)
   Covid-19 Vaccination Clinic  Name:  [name]    MRN: [id number] DOB: 07-29-1955  05/31/2019  [name] was observed post Covid-19 immunization for 15 minutes without incident. She was provided with Vaccine Information Sheet and instruction to access the V-Safe system.   [name] was instructed to call 911 with any severe reactions post vaccine: Marland Kitchen Difficulty breathing  . Swelling of face and throat  . A fast heartbeat  . A bad rash all over body  . Dizziness and weakness   Immunizations Administered    Name Date Dose VIS Date Route   Moderna COVID-19 Vaccine 05/31/2019 11:37 AM 0.5 mL 02/18/2019 Intramuscular   Manufacturer: Moderna   Lot: [id number]   NDC: 89211-941-74

## 2019-06-18 ENCOUNTER — Ambulatory Visit
Admission: RE | Admit: 2019-06-18 | Discharge: 2019-06-18 | Disposition: A | Discharge status: Home / Self Care | Payer: 59 | Source: Ambulatory Visit | Attending: Family | Admitting: Family

## 2019-06-18 DIAGNOSIS — Z1231 Encounter for screening mammogram for malignant neoplasm of breast: Secondary | ICD-10-CM | POA: Diagnosis present

## 2019-07-02 ENCOUNTER — Ambulatory Visit: Payer: 59 | Attending: Internal Medicine

## 2019-07-02 DIAGNOSIS — Z23 Encounter for immunization: Secondary | ICD-10-CM

## 2019-07-02 NOTE — Progress Notes (Signed)
   Covid-19 Vaccination Clinic  Name:  SHALINDA BURKHOLDER    MRN: [id number] DOB: [birth year]  07/02/2019  Ms. Casella was observed post Covid-19 immunization for 15 minutes without incident. She was provided with Vaccine Information Sheet and instruction to access the V-Safe system.   Ms. Ellingsen was instructed to call 911 with any severe reactions post vaccine: Marland Kitchen Difficulty breathing  . Swelling of face and throat  . A fast heartbeat  . A bad rash all over body  . Dizziness and weakness   Immunizations Administered    Name Date Dose VIS Date Route   Moderna COVID-19 Vaccine 07/02/2019 10:12 AM 0.5 mL 02/18/2019 Intramuscular   Manufacturer: Moderna   Lot: [id number]   NDC: 46190-122-24

## 2020-08-30 ENCOUNTER — Other Ambulatory Visit: Payer: Self-pay | Admitting: Internal Medicine

## 2020-08-30 DIAGNOSIS — Z1231 Encounter for screening mammogram for malignant neoplasm of breast: Secondary | ICD-10-CM

## 2020-09-09 ENCOUNTER — Other Ambulatory Visit: Payer: Self-pay

## 2020-09-09 ENCOUNTER — Ambulatory Visit
Admission: RE | Admit: 2020-09-09 | Discharge: 2020-09-09 | Disposition: A | Discharge status: Home / Self Care | Payer: Managed Care, Other (non HMO) | Source: Ambulatory Visit | Attending: Internal Medicine | Admitting: Internal Medicine

## 2020-09-09 DIAGNOSIS — Z1231 Encounter for screening mammogram for malignant neoplasm of breast: Secondary | ICD-10-CM | POA: Diagnosis not present

## 2020-12-20 ENCOUNTER — Other Ambulatory Visit: Payer: Self-pay | Admitting: Orthopedic Surgery

## 2020-12-20 DIAGNOSIS — M1712 Unilateral primary osteoarthritis, left knee: Secondary | ICD-10-CM

## 2021-01-07 ENCOUNTER — Ambulatory Visit
Admission: RE | Admit: 2021-01-07 | Discharge: 2021-01-07 | Disposition: A | Discharge status: Home / Self Care | Payer: Managed Care, Other (non HMO) | Source: Ambulatory Visit | Attending: Orthopedic Surgery | Admitting: Orthopedic Surgery

## 2021-01-07 ENCOUNTER — Other Ambulatory Visit: Payer: Self-pay

## 2021-01-07 DIAGNOSIS — M1712 Unilateral primary osteoarthritis, left knee: Secondary | ICD-10-CM | POA: Insufficient documentation

## 2021-02-07 ENCOUNTER — Other Ambulatory Visit: Payer: Self-pay | Admitting: Orthopedic Surgery

## 2021-02-21 ENCOUNTER — Other Ambulatory Visit: Payer: Self-pay

## 2021-02-21 ENCOUNTER — Encounter
Admission: RE | Admit: 2021-02-21 | Discharge: 2021-02-21 | Disposition: A | Discharge status: Home / Self Care | Payer: Managed Care, Other (non HMO) | Source: Ambulatory Visit | Attending: Orthopedic Surgery | Admitting: Orthopedic Surgery

## 2021-02-21 VITALS — BP 141/76 | HR 87 | Resp 16 | Ht 60.0 in | Wt 246.3 lb

## 2021-02-21 DIAGNOSIS — Z01818 Encounter for other preprocedural examination: Secondary | ICD-10-CM | POA: Insufficient documentation

## 2021-02-21 HISTORY — DX: Headache, unspecified: R51.9

## 2021-02-21 HISTORY — DX: Unspecified osteoarthritis, unspecified site: M19.90

## 2021-02-21 HISTORY — DX: Bronchitis, not specified as acute or chronic: J40

## 2021-02-21 HISTORY — DX: Sleep apnea, unspecified: G47.30

## 2021-02-21 HISTORY — DX: Type 2 diabetes mellitus without complications: E11.9

## 2021-02-21 HISTORY — DX: Hypothyroidism, unspecified: E03.9

## 2021-02-21 LAB — URINALYSIS, ROUTINE W REFLEX MICROSCOPIC
Bilirubin Urine: NEGATIVE
Glucose, UA: NEGATIVE mg/dL
Hgb urine dipstick: NEGATIVE
Ketones, ur: NEGATIVE mg/dL
Leukocytes,Ua: NEGATIVE
Nitrite: NEGATIVE
Protein, ur: NEGATIVE mg/dL
Specific Gravity, Urine: 1.008 (ref 1.005–1.030)
pH: 7 (ref 5.0–8.0)

## 2021-02-21 LAB — CBC WITH DIFFERENTIAL/PLATELET
Abs Immature Granulocytes: 0.03 10*3/uL (ref 0.00–0.07)
Basophils Absolute: 0 10*3/uL (ref 0.0–0.1)
Basophils Relative: 0 %
Eosinophils Absolute: 0.1 10*3/uL (ref 0.0–0.5)
Eosinophils Relative: 1 %
HCT: 38.5 % (ref 36.0–46.0)
Hemoglobin: 12.7 g/dL (ref 12.0–15.0)
Immature Granulocytes: 0 %
Lymphocytes Relative: 30 %
Lymphs Abs: 2.3 10*3/uL (ref 0.7–4.0)
MCH: 28.7 pg (ref 26.0–34.0)
MCHC: 33 g/dL (ref 30.0–36.0)
MCV: 86.9 fL (ref 80.0–100.0)
Monocytes Absolute: 0.5 10*3/uL (ref 0.1–1.0)
Monocytes Relative: 6 %
Neutro Abs: 4.8 10*3/uL (ref 1.7–7.7)
Neutrophils Relative %: 63 %
Platelets: 247 10*3/uL (ref 150–400)
RBC: 4.43 MIL/uL (ref 3.87–5.11)
RDW: 13.2 % (ref 11.5–15.5)
WBC: 7.7 10*3/uL (ref 4.0–10.5)
nRBC: 0 % (ref 0.0–0.2)

## 2021-02-21 LAB — COMPREHENSIVE METABOLIC PANEL
ALT: 19 U/L (ref 0–44)
AST: 19 U/L (ref 15–41)
Albumin: 4 g/dL (ref 3.5–5.0)
Alkaline Phosphatase: 88 U/L (ref 38–126)
Anion gap: 8 (ref 5–15)
BUN: 16 mg/dL (ref 8–23)
CO2: 27 mmol/L (ref 22–32)
Calcium: 9 mg/dL (ref 8.9–10.3)
Chloride: 103 mmol/L (ref 98–111)
Creatinine, Ser: 0.59 mg/dL (ref 0.44–1.00)
GFR, Estimated: >60 mL/min (ref >60)
Glucose, Bld: 103 mg/dL — ABNORMAL HIGH (ref 70–99)
Potassium: 3.5 mmol/L (ref 3.5–5.1)
Sodium: 138 mmol/L (ref 135–145)
Total Bilirubin: 0.5 mg/dL (ref 0.3–1.2)
Total Protein: 7.3 g/dL (ref 6.5–8.1)

## 2021-02-21 LAB — SURGICAL PCR SCREEN
MRSA, PCR: NEGATIVE
Staphylococcus aureus: NEGATIVE

## 2021-02-21 LAB — TYPE AND SCREEN
ABO/RH(D): A POS
Antibody Screen: NEGATIVE

## 2021-02-21 NOTE — Patient Instructions (Addendum)
Your procedure is scheduled on:03-01-21 Tuesday Report to the Registration Desk on the 1st floor of the Medical Mall.Then proceed to the 2nd floor Surgery Desk in the Medical Mall To find out your arrival time, please call [phone] between 1PM - 3PM on:02-28-21 Monday  REMEMBER: Instructions that are not followed completely may result in serious medical risk, up to and including death; or upon the discretion of your surgeon and anesthesiologist your surgery may need to be rescheduled.  Do not eat food after midnight the night before surgery.  No gum chewing, lozengers or hard candies.  You may however, drink Water up to 2 hours before you are scheduled to arrive for your surgery. Do not drink anything within 2 hours of your scheduled arrival time  Type 1 and Type 2 diabetics should only drink water.  TAKE THESE MEDICATIONS THE MORNING OF SURGERY WITH A SIP OF WATER: -buPROPion (WELLBUTRIN XL) 150 MG 24 hr tablet -citalopram (CELEXA) 20 MG tablet -levothyroxine (SYNTHROID) 112 MCG tablet -rosuvastatin (CRESTOR) 20 MG table  Use your albuterol (VENTOLIN HFA) 108 (90 Base) MCG/ACT inhaler the day of surgery and bring inhaler to hospital  One week prior to surgery: Stop Anti-inflammatories (NSAIDS) such as Advil, Aleve, Ibuprofen, Motrin, Naproxen, Naprosyn and Aspirin based products such as Excedrin, Goodys Powder, BC Powder.You may however, take Tylenol if needed for pain up until the day of surgery.  Stop ANY OVER THE COUNTER supplements/vitamins NOW (02-21-21) until after surgery.  No Alcohol for 24 hours before or after surgery.  No Smoking including e-cigarettes for 24 hours prior to surgery.  No chewable tobacco products for at least 6 hours prior to surgery.  No nicotine patches on the day of surgery.  Do not use any "recreational" drugs for at least a week prior to your surgery.  Please be advised that the combination of cocaine and anesthesia may have negative outcomes,  up to and including death. If you test positive for cocaine, your surgery will be cancelled.  On the morning of surgery brush your teeth with toothpaste and water, you may rinse your mouth with mouthwash if you wish. Do not swallow any toothpaste or mouthwash.  Use CHG Soap as directed on instruction sheet.  Do not wear jewelry, make-up, hairpins, clips or nail polish.  Do not wear lotions, powders, or perfumes.   Do not shave body from the neck down 48 hours prior to surgery just in case you cut yourself which could leave a site for infection.  Also, freshly shaved skin may become irritated if using the CHG soap.  Contact lenses, hearing aids and dentures may not be worn into surgery.  Do not bring valuables to the hospital. [facility] is not responsible for any missing/lost belongings or valuables.   Bring your C-PAP to the hospital with you in case you may have to spend the night.   Notify your doctor if there is any change in your medical condition (cold, fever, infection).  Wear comfortable clothing (specific to your surgery type) to the hospital.  After surgery, you can help prevent lung complications by doing breathing exercises.  Take deep breaths and cough every 1-2 hours. Your doctor may order a device called an Incentive Spirometer to help you take deep breaths. When coughing or sneezing, hold a pillow firmly against your incision with both hands. This is called "splinting." Doing this helps protect your incision. It also decreases belly discomfort.  If you are being admitted to the hospital overnight, leave  your suitcase in the car. After surgery it may be brought to your room.  If you are being discharged the day of surgery, you will not be allowed to drive home. You will need a responsible adult (18 years or older) to drive you home and stay with you that night.   If you are taking public transportation, you will need to have a responsible adult (18 years or older)  with you. Please confirm with your physician that it is acceptable to use public transportation.   Please call the Pre-admissions Testing Dept. at [phone] if you have any questions about these instructions.  Surgery Visitation Policy:  Patients undergoing a surgery or procedure may have one family member or support person with them as long as that person is not COVID-19 positive or experiencing its symptoms.  That person may remain in the waiting area during the procedure and may rotate out with other people.  Inpatient Visitation:    Visiting hours are 7 a.m. to 8 p.m. Up to two visitors ages 16+ are allowed at one time in a patient room. The visitors may rotate out with other people during the day. Visitors must check out when they leave, or other visitors will not be allowed. One designated support person may remain overnight. The visitor must pass COVID-19 screenings, use hand sanitizer when entering and exiting the patient's room and wear a mask at all times, including in the patient's room. Patients must also wear a mask when staff or their visitor are in the room. Masking is required regardless of vaccination status.

## 2021-02-25 ENCOUNTER — Other Ambulatory Visit: Payer: Self-pay

## 2021-02-25 ENCOUNTER — Other Ambulatory Visit
Admission: RE | Admit: 2021-02-25 | Discharge: 2021-02-25 | Disposition: A | Discharge status: Home / Self Care | Payer: Managed Care, Other (non HMO) | Source: Ambulatory Visit | Attending: Orthopedic Surgery | Admitting: Orthopedic Surgery

## 2021-02-25 DIAGNOSIS — Z20822 Contact with and (suspected) exposure to covid-19: Secondary | ICD-10-CM | POA: Insufficient documentation

## 2021-02-25 DIAGNOSIS — Z01812 Encounter for preprocedural laboratory examination: Secondary | ICD-10-CM | POA: Insufficient documentation

## 2021-02-25 LAB — SARS CORONAVIRUS 2 (TAT 6-24 HRS): SARS Coronavirus 2: NEGATIVE

## 2021-03-01 ENCOUNTER — Ambulatory Visit: Payer: Managed Care, Other (non HMO) | Admitting: Urgent Care

## 2021-03-01 ENCOUNTER — Encounter: Payer: Self-pay | Admitting: Orthopedic Surgery

## 2021-03-01 ENCOUNTER — Observation Stay
Admission: RE | Admit: 2021-03-01 | Discharge: 2021-03-02 | Disposition: A | Discharge status: Home / Self Care | Payer: Managed Care, Other (non HMO) | Source: Ambulatory Visit | Attending: Orthopedic Surgery | Admitting: Orthopedic Surgery

## 2021-03-01 ENCOUNTER — Other Ambulatory Visit: Payer: Self-pay

## 2021-03-01 ENCOUNTER — Encounter
Admission: RE | Disposition: A | Discharge status: Home / Self Care | Payer: Self-pay | Source: Ambulatory Visit | Attending: Orthopedic Surgery

## 2021-03-01 ENCOUNTER — Observation Stay: Payer: Managed Care, Other (non HMO)

## 2021-03-01 ENCOUNTER — Ambulatory Visit: Payer: Managed Care, Other (non HMO) | Admitting: Certified Registered"

## 2021-03-01 DIAGNOSIS — Z79899 Other long term (current) drug therapy: Secondary | ICD-10-CM | POA: Diagnosis not present

## 2021-03-01 DIAGNOSIS — M1712 Unilateral primary osteoarthritis, left knee: Secondary | ICD-10-CM | POA: Diagnosis not present

## 2021-03-01 DIAGNOSIS — I10 Essential (primary) hypertension: Secondary | ICD-10-CM | POA: Insufficient documentation

## 2021-03-01 DIAGNOSIS — E119 Type 2 diabetes mellitus without complications: Secondary | ICD-10-CM | POA: Insufficient documentation

## 2021-03-01 DIAGNOSIS — E039 Hypothyroidism, unspecified: Secondary | ICD-10-CM | POA: Diagnosis not present

## 2021-03-01 DIAGNOSIS — G8918 Other acute postprocedural pain: Secondary | ICD-10-CM

## 2021-03-01 DIAGNOSIS — Z96652 Presence of left artificial knee joint: Secondary | ICD-10-CM

## 2021-03-01 HISTORY — PX: TOTAL KNEE ARTHROPLASTY: SHX125

## 2021-03-01 LAB — CBC
HCT: 35.8 % — ABNORMAL LOW (ref 36.0–46.0)
Hemoglobin: 11.9 g/dL — ABNORMAL LOW (ref 12.0–15.0)
MCH: 28.4 pg (ref 26.0–34.0)
MCHC: 33.2 g/dL (ref 30.0–36.0)
MCV: 85.4 fL (ref 80.0–100.0)
Platelets: 234 10*3/uL (ref 150–400)
RBC: 4.19 MIL/uL (ref 3.87–5.11)
RDW: 13.1 % (ref 11.5–15.5)
WBC: 14 10*3/uL — ABNORMAL HIGH (ref 4.0–10.5)
nRBC: 0 % (ref 0.0–0.2)

## 2021-03-01 LAB — CREATININE, SERUM
Creatinine, Ser: 0.78 mg/dL (ref 0.44–1.00)
GFR, Estimated: >60 mL/min (ref >60)

## 2021-03-01 LAB — GLUCOSE, CAPILLARY
Glucose-Capillary: 119 mg/dL — ABNORMAL HIGH (ref 70–99)
Glucose-Capillary: 94 mg/dL (ref 70–99)
Glucose-Capillary: 120 mg/dL — ABNORMAL HIGH (ref 70–99)
Glucose-Capillary: 108 mg/dL — ABNORMAL HIGH (ref 70–99)

## 2021-03-01 LAB — ABO/RH: ABO/RH(D): A POS

## 2021-03-01 SURGERY — ARTHROPLASTY, KNEE, TOTAL
Anesthesia: Spinal | Site: Knee | Laterality: Left

## 2021-03-01 MED ORDER — TRAMADOL HCL 50 MG PO TABS
50.0000 mg | ORAL_TABLET | Freq: Four times a day (QID) | ORAL | Status: DC
  Administered 2021-03-02: 04:00:00 50 mg via ORAL

## 2021-03-01 MED ORDER — METHOCARBAMOL 500 MG PO TABS
500.0000 mg | ORAL_TABLET | Freq: Four times a day (QID) | ORAL | Status: DC | PRN
  Administered 2021-03-01 – 2021-03-02 (×3): 500 mg via ORAL

## 2021-03-01 MED ORDER — PROPOFOL 1000 MG/100ML IV EMUL
INTRAVENOUS | Status: AC
  Filled 2021-03-01: qty 100

## 2021-03-01 MED ORDER — SODIUM CHLORIDE FLUSH 0.9 % IV SOLN
INTRAVENOUS | Status: AC
  Filled 2021-03-01: qty 40

## 2021-03-01 MED ORDER — PHENOL 1.4 % MT LIQD
1.0000 | OROMUCOSAL | Status: DC | PRN
  Filled 2021-03-01: qty 177

## 2021-03-01 MED ORDER — ROSUVASTATIN CALCIUM 20 MG PO TABS
20.0000 mg | ORAL_TABLET | ORAL | Status: DC
  Administered 2021-03-02: 06:00:00 20 mg via ORAL
  Filled 2021-03-01 (×2): qty 1

## 2021-03-01 MED ORDER — ACETAMINOPHEN 10 MG/ML IV SOLN
INTRAVENOUS | Status: DC | PRN
  Administered 2021-03-01: 1000 mg via INTRAVENOUS

## 2021-03-01 MED ORDER — PRONTOSAN WOUND IRRIGATION OPTIME
TOPICAL | Status: DC | PRN
  Administered 2021-03-01: 1

## 2021-03-01 MED ORDER — BUPROPION HCL ER (XL) 150 MG PO TB24
150.0000 mg | ORAL_TABLET | ORAL | Status: DC
  Administered 2021-03-02: 06:00:00 150 mg via ORAL
  Filled 2021-03-01 (×2): qty 1

## 2021-03-01 MED ORDER — FAMOTIDINE 20 MG PO TABS
ORAL_TABLET | ORAL | Status: AC
  Administered 2021-03-01: 20 mg via ORAL
  Filled 2021-03-01: qty 1

## 2021-03-01 MED ORDER — SEMAGLUTIDE (1 MG/DOSE) 4 MG/3ML ~~LOC~~ SOPN: 1.0000 mg | PEN_INJECTOR | SUBCUTANEOUS | Status: DC

## 2021-03-01 MED ORDER — INSULIN ASPART 100 UNIT/ML IJ SOLN: 0.0000 [IU] | Freq: Three times a day (TID) | INTRAMUSCULAR | Status: DC

## 2021-03-01 MED ORDER — LISINOPRIL 10 MG PO TABS
10.0000 mg | ORAL_TABLET | ORAL | Status: DC
  Administered 2021-03-02: 06:00:00 10 mg via ORAL
  Filled 2021-03-01 (×2): qty 1

## 2021-03-01 MED ORDER — MIDAZOLAM HCL 5 MG/5ML IJ SOLN
INTRAMUSCULAR | Status: DC | PRN
  Administered 2021-03-01: 2 mg via INTRAVENOUS

## 2021-03-01 MED ORDER — CHLORHEXIDINE GLUCONATE 0.12 % MT SOLN: 15.0000 mL | Freq: Once | OROMUCOSAL | Status: AC

## 2021-03-01 MED ORDER — SODIUM CHLORIDE 0.9 % IV SOLN: INTRAVENOUS | Status: DC

## 2021-03-01 MED ORDER — ACETAMINOPHEN 10 MG/ML IV SOLN
INTRAVENOUS | Status: AC
  Filled 2021-03-01: qty 100

## 2021-03-01 MED ORDER — METHOCARBAMOL 500 MG PO TABS
ORAL_TABLET | ORAL | Status: AC
  Filled 2021-03-01: qty 1

## 2021-03-01 MED ORDER — BUPIVACAINE IN DEXTROSE 0.75-8.25 % IT SOLN
INTRATHECAL | Status: DC | PRN
  Administered 2021-03-01: 2.5 mL via INTRATHECAL

## 2021-03-01 MED ORDER — BUPIVACAINE LIPOSOME 1.3 % IJ SUSP
INTRAMUSCULAR | Status: AC
  Filled 2021-03-01: qty 20

## 2021-03-01 MED ORDER — ZOLPIDEM TARTRATE 5 MG PO TABS: 5.0000 mg | ORAL_TABLET | Freq: Every evening | ORAL | Status: DC | PRN

## 2021-03-01 MED ORDER — HYDROCODONE-ACETAMINOPHEN 7.5-325 MG PO TABS
1.0000 | ORAL_TABLET | ORAL | Status: DC | PRN
  Administered 2021-03-01: 2 via ORAL

## 2021-03-01 MED ORDER — ACETAMINOPHEN 325 MG PO TABS: 325.0000 mg | ORAL_TABLET | Freq: Four times a day (QID) | ORAL | Status: DC | PRN

## 2021-03-01 MED ORDER — CITALOPRAM HYDROBROMIDE 20 MG PO TABS
30.0000 mg | ORAL_TABLET | ORAL | Status: DC
  Administered 2021-03-02: 06:00:00 30 mg via ORAL
  Filled 2021-03-01 (×2): qty 1

## 2021-03-01 MED ORDER — BUPIVACAINE-EPINEPHRINE (PF) 0.25% -1:200000 IJ SOLN
INTRAMUSCULAR | Status: AC
  Filled 2021-03-01: qty 30

## 2021-03-01 MED ORDER — CEFAZOLIN SODIUM-DEXTROSE 2-4 GM/100ML-% IV SOLN
INTRAVENOUS | Status: AC
  Filled 2021-03-01: qty 100

## 2021-03-01 MED ORDER — FLEET ENEMA 7-19 GM/118ML RE ENEM: 1.0000 | ENEMA | Freq: Once | RECTAL | Status: DC | PRN

## 2021-03-01 MED ORDER — NEOMYCIN-POLYMYXIN B GU 40-200000 IR SOLN
Status: AC
  Filled 2021-03-01: qty 20

## 2021-03-01 MED ORDER — HYDROCODONE-ACETAMINOPHEN 7.5-325 MG PO TABS
ORAL_TABLET | ORAL | Status: AC
  Filled 2021-03-01: qty 2

## 2021-03-01 MED ORDER — ORAL CARE MOUTH RINSE: 15.0000 mL | Freq: Once | OROMUCOSAL | Status: AC

## 2021-03-01 MED ORDER — PROPOFOL 10 MG/ML IV BOLUS
INTRAVENOUS | Status: DC | PRN
  Administered 2021-03-01: 40 mg via INTRAVENOUS

## 2021-03-01 MED ORDER — DIPHENHYDRAMINE HCL 12.5 MG/5ML PO ELIX
12.5000 mg | ORAL_SOLUTION | ORAL | Status: DC | PRN
  Administered 2021-03-01 – 2021-03-02 (×2): 25 mg via ORAL
  Filled 2021-03-01 (×4): qty 10

## 2021-03-01 MED ORDER — SODIUM CHLORIDE 0.9 % IR SOLN
Status: DC | PRN
  Administered 2021-03-01: 500 mL

## 2021-03-01 MED ORDER — ALUM & MAG HYDROXIDE-SIMETH 200-200-20 MG/5ML PO SUSP: 30.0000 mL | ORAL | Status: DC | PRN

## 2021-03-01 MED ORDER — ENOXAPARIN SODIUM 30 MG/0.3ML IJ SOSY
30.0000 mg | PREFILLED_SYRINGE | Freq: Two times a day (BID) | INTRAMUSCULAR | Status: DC
  Filled 2021-03-01: qty 0.3

## 2021-03-01 MED ORDER — CHLORHEXIDINE GLUCONATE 0.12 % MT SOLN
OROMUCOSAL | Status: AC
  Administered 2021-03-01: 15 mL via OROMUCOSAL
  Filled 2021-03-01: qty 15

## 2021-03-01 MED ORDER — BISACODYL 10 MG RE SUPP
10.0000 mg | Freq: Every day | RECTAL | Status: DC | PRN
  Filled 2021-03-01: qty 1

## 2021-03-01 MED ORDER — METOCLOPRAMIDE HCL 5 MG PO TABS
5.0000 mg | ORAL_TABLET | Freq: Three times a day (TID) | ORAL | Status: DC | PRN
  Filled 2021-03-01: qty 2

## 2021-03-01 MED ORDER — HYDROCODONE-ACETAMINOPHEN 7.5-325 MG PO TABS
ORAL_TABLET | ORAL | Status: AC
  Administered 2021-03-01: 2 via ORAL
  Filled 2021-03-01: qty 2

## 2021-03-01 MED ORDER — LEVOTHYROXINE SODIUM 112 MCG PO TABS
112.0000 ug | ORAL_TABLET | Freq: Every day | ORAL | Status: DC
  Administered 2021-03-02: 06:00:00 112 ug via ORAL
  Filled 2021-03-01 (×2): qty 1

## 2021-03-01 MED ORDER — MIDAZOLAM HCL 2 MG/2ML IJ SOLN
INTRAMUSCULAR | Status: AC
  Filled 2021-03-01: qty 2

## 2021-03-01 MED ORDER — ONDANSETRON HCL 4 MG/2ML IJ SOLN: 4.0000 mg | Freq: Once | INTRAMUSCULAR | Status: DC | PRN

## 2021-03-01 MED ORDER — MORPHINE SULFATE (PF) 4 MG/ML IV SOLN
INTRAVENOUS | Status: AC
  Administered 2021-03-01: 1 mg via INTRAVENOUS
  Filled 2021-03-01: qty 1

## 2021-03-01 MED ORDER — FENTANYL CITRATE (PF) 100 MCG/2ML IJ SOLN: 25.0000 ug | INTRAMUSCULAR | Status: DC | PRN

## 2021-03-01 MED ORDER — SODIUM CHLORIDE 0.9 % IR SOLN
Status: DC | PRN
  Administered 2021-03-01: 3012 mL

## 2021-03-01 MED ORDER — TRAMADOL HCL 50 MG PO TABS
ORAL_TABLET | ORAL | Status: AC
  Administered 2021-03-01: 50 mg via ORAL
  Filled 2021-03-01: qty 1

## 2021-03-01 MED ORDER — CEFAZOLIN SODIUM-DEXTROSE 2-4 GM/100ML-% IV SOLN
2.0000 g | INTRAVENOUS | Status: AC
  Administered 2021-03-01: 2 g via INTRAVENOUS

## 2021-03-01 MED ORDER — PHENYLEPHRINE HCL (PRESSORS) 10 MG/ML IV SOLN
INTRAVENOUS | Status: DC | PRN
Start: 2021-03-01 — End: 2021-03-01
  Administered 2021-03-01 (×5): 100 ug via INTRAVENOUS

## 2021-03-01 MED ORDER — METOCLOPRAMIDE HCL 5 MG/ML IJ SOLN: 5.0000 mg | Freq: Three times a day (TID) | INTRAMUSCULAR | Status: DC | PRN

## 2021-03-01 MED ORDER — CEFAZOLIN SODIUM-DEXTROSE 2-4 GM/100ML-% IV SOLN: 2.0000 g | Freq: Four times a day (QID) | INTRAVENOUS | Status: AC

## 2021-03-01 MED ORDER — SODIUM CHLORIDE (PF) 0.9 % IJ SOLN
INTRAMUSCULAR | Status: DC | PRN
  Administered 2021-03-01: 92 mL via INTRAMUSCULAR

## 2021-03-01 MED ORDER — MORPHINE SULFATE (PF) 4 MG/ML IV SOLN: 0.5000 mg | INTRAVENOUS | Status: DC | PRN

## 2021-03-01 MED ORDER — FAMOTIDINE 20 MG PO TABS: 20.0000 mg | ORAL_TABLET | Freq: Once | ORAL | Status: AC

## 2021-03-01 MED ORDER — POLYETHYLENE GLYCOL 3350 17 G PO PACK
17.0000 g | PACK | Freq: Every day | ORAL | Status: DC | PRN
  Filled 2021-03-01: qty 1

## 2021-03-01 MED ORDER — ONDANSETRON HCL 4 MG PO TABS
4.0000 mg | ORAL_TABLET | Freq: Four times a day (QID) | ORAL | Status: DC | PRN
  Filled 2021-03-01: qty 1

## 2021-03-01 MED ORDER — MENTHOL 3 MG MT LOZG
1.0000 | LOZENGE | OROMUCOSAL | Status: DC | PRN
  Filled 2021-03-01: qty 9

## 2021-03-01 MED ORDER — HYDROCODONE-ACETAMINOPHEN 5-325 MG PO TABS: 1.0000 | ORAL_TABLET | ORAL | Status: DC | PRN

## 2021-03-01 MED ORDER — TRANEXAMIC ACID-NACL 1000-0.7 MG/100ML-% IV SOLN
INTRAVENOUS | Status: DC | PRN
  Administered 2021-03-01: 1000 mg via INTRAVENOUS

## 2021-03-01 MED ORDER — TRANEXAMIC ACID 1000 MG/10ML IV SOLN
INTRAVENOUS | Status: AC
  Filled 2021-03-01: qty 10

## 2021-03-01 MED ORDER — ONDANSETRON HCL 4 MG/2ML IJ SOLN: 4.0000 mg | Freq: Four times a day (QID) | INTRAMUSCULAR | Status: DC | PRN

## 2021-03-01 MED ORDER — MORPHINE SULFATE (PF) 10 MG/ML IV SOLN
INTRAVENOUS | Status: AC
  Filled 2021-03-01: qty 1

## 2021-03-01 MED ORDER — TRAMADOL HCL 50 MG PO TABS
ORAL_TABLET | ORAL | Status: AC
  Filled 2021-03-01: qty 1

## 2021-03-01 MED ORDER — METHOCARBAMOL 1000 MG/10ML IJ SOLN
500.0000 mg | Freq: Four times a day (QID) | INTRAVENOUS | Status: DC | PRN
  Filled 2021-03-01: qty 5

## 2021-03-01 MED ORDER — PROPOFOL 500 MG/50ML IV EMUL
INTRAVENOUS | Status: DC | PRN
  Administered 2021-03-01: 80 ug/kg/min via INTRAVENOUS

## 2021-03-01 MED ORDER — CEFAZOLIN SODIUM-DEXTROSE 2-4 GM/100ML-% IV SOLN
INTRAVENOUS | Status: AC
  Administered 2021-03-01: 22:00:00 2 g via INTRAVENOUS
  Filled 2021-03-01: qty 100

## 2021-03-01 MED ORDER — ALBUTEROL SULFATE (2.5 MG/3ML) 0.083% IN NEBU: 3.0000 mL | INHALATION_SOLUTION | Freq: Four times a day (QID) | RESPIRATORY_TRACT | Status: DC | PRN

## 2021-03-01 MED ORDER — KETOROLAC TROMETHAMINE 30 MG/ML IJ SOLN
INTRAMUSCULAR | Status: AC
  Filled 2021-03-01: qty 1

## 2021-03-01 MED ORDER — DOCUSATE SODIUM 100 MG PO CAPS
100.0000 mg | ORAL_CAPSULE | Freq: Two times a day (BID) | ORAL | Status: DC
  Administered 2021-03-01 – 2021-03-02 (×3): 100 mg via ORAL
  Filled 2021-03-01 (×4): qty 1

## 2021-03-01 MED ORDER — LEVOTHYROXINE SODIUM 112 MCG PO TABS: 112.0000 ug | ORAL_TABLET | Freq: Every morning | ORAL | Status: DC

## 2021-03-01 MED ORDER — CEFAZOLIN SODIUM-DEXTROSE 2-4 GM/100ML-% IV SOLN
INTRAVENOUS | Status: AC
  Administered 2021-03-01: 2 g via INTRAVENOUS
  Filled 2021-03-01: qty 100

## 2021-03-01 MED ORDER — ENOXAPARIN SODIUM 40 MG/0.4ML IJ SOSY
40.0000 mg | PREFILLED_SYRINGE | INTRAMUSCULAR | Status: DC
  Administered 2021-03-02: 08:00:00 40 mg via SUBCUTANEOUS

## 2021-03-01 SURGICAL SUPPLY — 73 items
BLADE SAGITTAL 25.0X1.19X90 (BLADE) ×2 IMPLANT
BLADE SAGITTAL 25.0X1.19X90MM (BLADE) ×1
BLADE SAW 90X13X1.19 OSCILLAT (BLADE) ×3 IMPLANT
BLOCK CUTTING FEMUR 2 LT MED (MISCELLANEOUS) ×2 IMPLANT
BLOCK CUTTING TIBIAL 1 LT (MISCELLANEOUS) ×2 IMPLANT
BNDG ELASTIC 6X5.8 VLCR STR LF (GAUZE/BANDAGES/DRESSINGS) ×3 IMPLANT
CANISTER WOUND CARE 500ML ATS (WOUND CARE) ×3 IMPLANT
CEMENT HV SMART SET (Cement) ×6 IMPLANT
CHLORAPREP W/TINT 26 (MISCELLANEOUS) ×6 IMPLANT
COOLER POLAR GLACIER W/PUMP (MISCELLANEOUS) ×3 IMPLANT
CUFF TOURN SGL QUICK 24 (TOURNIQUET CUFF)
CUFF TOURN SGL QUICK 34 (TOURNIQUET CUFF)
CUFF TRNQT CYL 24X4X16.5-23 (TOURNIQUET CUFF) IMPLANT
CUFF TRNQT CYL 34X4.125X (TOURNIQUET CUFF) IMPLANT
DRAPE 3/4 80X56 (DRAPES) ×6 IMPLANT
DRSG MEPILEX SACRM 8.7X9.8 (GAUZE/BANDAGES/DRESSINGS) ×3 IMPLANT
ELECT CAUTERY BLADE 6.4 (BLADE) ×3 IMPLANT
ELECT REM PT RETURN 9FT ADLT (ELECTROSURGICAL) ×3
ELECTRODE REM PT RTRN 9FT ADLT (ELECTROSURGICAL) ×1 IMPLANT
FEMORAL COMPONENT SZ2 LEFT (Femur) ×2 IMPLANT
FEMUR BONE MODEL 4.9010 MEDACT (MISCELLANEOUS) ×2 IMPLANT
GAUZE 4X4 16PLY ~~LOC~~+RFID DBL (SPONGE) ×3 IMPLANT
GAUZE SPONGE 4X4 12PLY STRL (GAUZE/BANDAGES/DRESSINGS) ×1 IMPLANT
GAUZE XEROFORM 1X8 LF (GAUZE/BANDAGES/DRESSINGS) ×1 IMPLANT
GLOVE SURG ORTHO LTX SZ8 (GLOVE) ×3 IMPLANT
GLOVE SURG SYN 9.0  PF PI (GLOVE) ×3
GLOVE SURG SYN 9.0 PF PI (GLOVE) ×1 IMPLANT
GLOVE SURG UNDER LTX SZ8 (GLOVE) ×3 IMPLANT
GLOVE SURG UNDER POLY LF SZ9 (GLOVE) ×3 IMPLANT
GOWN SRG 2XL LVL 4 RGLN SLV (GOWNS) ×1 IMPLANT
GOWN STRL NON-REIN 2XL LVL4 (GOWNS) ×3
GOWN STRL REUS W/ TWL LRG LVL3 (GOWN DISPOSABLE) ×1 IMPLANT
GOWN STRL REUS W/ TWL XL LVL3 (GOWN DISPOSABLE) ×1 IMPLANT
GOWN STRL REUS W/TWL LRG LVL3 (GOWN DISPOSABLE) ×3
GOWN STRL REUS W/TWL XL LVL3 (GOWN DISPOSABLE) ×3
HOLDER FOLEY CATH W/STRAP (MISCELLANEOUS) ×3 IMPLANT
INSERT TIB FLEX LT SZ1 12 (Insert) ×2 IMPLANT
IV NS IRRIG 3000ML ARTHROMATIC (IV SOLUTION) ×3 IMPLANT
KIT PREVENA INCISION MGT20CM45 (CANNISTER) ×3 IMPLANT
KIT TURNOVER KIT A (KITS) ×3 IMPLANT
MANIFOLD NEPTUNE II (INSTRUMENTS) ×3 IMPLANT
NDL SAFETY ECLIPSE 18X1.5 (NEEDLE) ×1 IMPLANT
NDL SPNL 18GX3.5 QUINCKE PK (NEEDLE) ×1 IMPLANT
NDL SPNL 20GX3.5 QUINCKE YW (NEEDLE) ×1 IMPLANT
NEEDLE HYPO 18GX1.5 SHARP (NEEDLE) ×3
NEEDLE SPNL 18GX3.5 QUINCKE PK (NEEDLE) ×3 IMPLANT
NEEDLE SPNL 20GX3.5 QUINCKE YW (NEEDLE) ×3 IMPLANT
NS IRRIG 1000ML POUR BTL (IV SOLUTION) ×3 IMPLANT
PACK TOTAL KNEE (MISCELLANEOUS) ×3 IMPLANT
PAD WRAPON POLAR KNEE (MISCELLANEOUS) ×1 IMPLANT
PATELLA RESURFACING MEDACTA 02 (Bone Implant) ×2 IMPLANT
PENCIL SMOKE EVACUATOR COATED (MISCELLANEOUS) ×3 IMPLANT
PULSAVAC PLUS IRRIG FAN TIP (DISPOSABLE) ×3
SCALPEL PROTECTED #10 DISP (BLADE) ×6 IMPLANT
SOLUTION PRONTOSAN WOUND 350ML (IRRIGATION / IRRIGATOR) IMPLANT
SPONGE T-LAP 18X18 ~~LOC~~+RFID (SPONGE) ×9 IMPLANT
STAPLER SKIN PROX 35W (STAPLE) ×3 IMPLANT
SUCTION FRAZIER HANDLE 10FR (MISCELLANEOUS) ×3
SUCTION TUBE FRAZIER 10FR DISP (MISCELLANEOUS) ×1 IMPLANT
SUT DVC 2 QUILL PDO  T11 36X36 (SUTURE) ×3
SUT DVC 2 QUILL PDO T11 36X36 (SUTURE) ×1 IMPLANT
SUT ETHIBOND 2 V 37 (SUTURE) IMPLANT
SUT V-LOC 90 ABS DVC 3-0 CL (SUTURE) ×3 IMPLANT
SYR 20ML LL LF (SYRINGE) ×3 IMPLANT
SYR 50ML LL SCALE MARK (SYRINGE) ×6 IMPLANT
TIB TRAY FIXED SZ1 LEFT CEM 02 (Joint) ×2 IMPLANT
TIBIAL BONE MODEL LEFT (MISCELLANEOUS) ×2 IMPLANT
TIP FAN IRRIG PULSAVAC PLUS (DISPOSABLE) ×1 IMPLANT
TOWEL OR 17X26 4PK STRL BLUE (TOWEL DISPOSABLE) ×3 IMPLANT
TOWER CARTRIDGE SMART MIX (DISPOSABLE) ×3 IMPLANT
TRAY FOLEY MTR SLVR 16FR STAT (SET/KITS/TRAYS/PACK) ×3 IMPLANT
WATER STERILE IRR 1000ML POUR (IV SOLUTION) ×2 IMPLANT
WRAPON POLAR PAD KNEE (MISCELLANEOUS) ×3

## 2021-03-01 NOTE — H&P (Signed)
Pre-op Exam  Left TKA scheduled 03/01/21 with Dr. Rosita Kea    History of the Present Illness: [name] is a 65 y.o. female here today for history and physical for left total knee arthroplasty with Dr. Kennedy Bucker on 03/01/2021. X-rays from April 2020 does show advanced left knee osteoarthritis with joint space narrowing, extensive osteophytes. She has severe degenerative changes in the patellofemoral compartment with slight varus deformity of the knee. She has underwent conservative treatment with cortisone injections and viscosupplementation with no relief. She is currently taking Norco to help her get some relief at night. Pain is moderate to severe throughout the knee worse in the medial and patellofemoral compartments and interferes with her ability to walk and perform activities of daily living. She is currently using Voltaren gel and taking Norco 1 tablet every 6 hours as needed for pain.  The patient has never had blood clots. She has no right knee pain. Her A1c has been good.  She is employed with [employer], and needs to wait for surgery until her coworker returns from her surgery recovery.  I have reviewed past medical, surgical, social and family history, and allergies as documented in the EMR.  Past Medical History: Past Medical History:  Diagnosis Date   Chronic pain of both knees 06/15/2016   Depression   Diabetes mellitus without complication (CMS-HCC)   GAD (generalized anxiety disorder) 08/23/2018   Hyperlipidemia   Hypertension   Insomnia 12/17/2018   Morbid obesity with BMI of 40.0-44.9 (HCC) 07/07/2015   Seasonal allergies 08/23/2018   Sleep apnea   Thyroid disease   Type 2 diabetes mellitus without complication, without long-term current use of insulin (CMS-HCC) 08/23/2018   Ulcer of the stomach and intestine 1985   Past Surgical History: Past Surgical History:  Procedure Laterality Date   back surgery 2002   CESAREAN SECTION   CHOLECYSTECTOMY   ENDOSCOPIC CARPAL TUNNEL  RELEASE Left 2008  [name]   foot surgery Left 2012   HYSTERECTOMY   Past Family History: Family History  Problem Relation Age of Onset   Cancer Mother  spinal   Coronary Artery Disease (Blocked arteries around heart) Father   Heart disease Father   Coronary Artery Disease (Blocked arteries around heart) Sister   Medications: Current Outpatient Medications Ordered in Epic  Medication Sig Dispense Refill   albuterol 90 mcg/actuation inhaler Inhale 2 inhalations into the lungs every 4 (four) hours as needed 6.7 g 0   buPROPion (WELLBUTRIN XL) 150 MG XL tablet TAKE ONE TABLET (150 MG) BY MOUTH EVERY DAY 90 tablet 1   citalopram (CELEXA) 20 MG tablet Take 1.5 tablets (30 mg total) by mouth once daily 45 tablet 5   HYDROcodone-acetaminophen (NORCO) 5-325 mg tablet Take 1 tablet by mouth every 6 (six) hours as needed 40 tablet 0   levothyroxine (SYNTHROID) 112 MCG tablet TAKE ONE TABLET BY MOUTH EVERY DAY 90 tablet 1   lisinopriL (ZESTRIL) 10 MG tablet TAKE ONE TABLET (10 MG) BY MOUTH EVERY DAY 90 tablet 1   meloxicam (MOBIC) 15 MG tablet Take 1 tablet (15 mg total) by mouth once daily for 30 days 30 tablet 1   pen needle, diabetic 31 gauge x 5/16" needle Use as directed 100 each 12   rosuvastatin (CRESTOR) 20 MG tablet TAKE ONE TABLET (20 MG) BY MOUTH EVERY DAY 90 tablet 1   semaglutide (OZEMPIC) 1 mg/dose (2 mg/1.5 mL) pen injector Inject 0.75 mLs (1 mg total) subcutaneously once a week for 90 days 3  mL 2   No current Epic-ordered facility-administered medications on file.   Allergies: No Known Allergies   Body mass index is 46.87 kg/m.  Review of Systems: A comprehensive 14 point ROS was performed, reviewed, and the pertinent orthopaedic findings are documented in the HPI.  Vitals:  02/24/21 1415  BP: 120/80    General Physical Examination:   General:  Well developed, well nourished, no apparent distress, normal affect, normal gait with no antalgic component.   HEENT: Head  normocephalic, atraumatic, PERRL.   Abdomen: Soft, non tender, non distended, Bowel sounds present.  Heart: Examination of the heart reveals regular, rate, and rhythm. There is no murmur noted on ascultation. There is a normal apical pulse.  Lungs: Lungs are clear to auscultation. There is no wheeze, rhonchi, or crackles. There is normal expansion of bilateral chest walls.   Left knee: On exam, left knee range of motion of 5 to 105 degrees. No swelling or edema. Normal hip range of motion with no discomfort. She is able to actively straight leg raise. Patella tracks well patellofemoral crepitus.  Radiographs:  X-rays of the left knee reviewed by me today from April 2022 show no evidence of acute bony abnormality or abnormal bony lesions. 75% loss of joint space in the medial compartment with sclerotic changes along the medial tibial plateau with spurring. Varus deformity noted. Severe patellofemoral arthritic changes and spurring with lateral tracking of the patella in the trochlear groove.  Assessment: ICD-10-CM  1. Primary localized osteoarthritis of left knee M17.12   Plan:  28. 65 year old female with advanced left knee osteoarthritis. Pain is interfering with quality life and activities daily living despite conservative treatment. She is seen Dr. Rosita Kea, agreed and consented to a left total knee arthroplasty. Risks, benefits, complications of a left total knee arthroplasty have been discussed with the patient. Patient has agreed and consented procedure with Dr. Kennedy Bucker on 03/01/2021.   Electronically signed by Patience Musca, PA at 02/24/2021 2:21 PM EST  Back to top of Progress Notes   Reviewed  H+P. No changes noted.

## 2021-03-01 NOTE — Evaluation (Signed)
Physical Therapy Evaluation Patient Details Name: [name] MRN: [id number] DOB: Jun 16, 1955 Today's Date: 03/01/2021  History of Present Illness  65 y/o female s/p L TKA 03/01/21.  Clinical Impression  Pt did very well with POD0 PT session.  She was able to easily do quad sets and straight leg raises, she showed good confidence and safety with mobility and was able to ambulate ~100 ft with relatively consistent and confident cadence.  Pt eager to work with PT and to continue with excellent post-op trajectory to be able to go home tomorrow.       Recommendations for follow up therapy are one component of a multi-disciplinary discharge planning process, led by the attending physician.  Recommendations may be updated based on patient status, additional functional criteria and insurance authorization.  Follow Up Recommendations Follow physician's recommendations for discharge plan and follow up therapies    Assistance Recommended at Discharge Intermittent Supervision/Assistance  Functional Status Assessment Patient has had a recent decline in their functional status and demonstrates the ability to make significant improvements in function in a reasonable and predictable amount of time.  Equipment Recommendations  Other (comment) (may have a walker and BSC, needs to check with husband)    Recommendations for Other Services       Precautions / Restrictions Precautions Precautions: Fall Restrictions Weight Bearing Restrictions: Yes LLE Weight Bearing: Weight bearing as tolerated      Mobility  Bed Mobility Overal bed mobility: Modified Independent                  Transfers Overall transfer level: Modified independent Equipment used: Rolling walker (2 wheels)               General transfer comment: With minimal cuing for sequencing pt was able to rise w/o assist or hesitation    Ambulation/Gait Ambulation/Gait assistance: Supervision Gait Distance (Feet):  100 Feet Assistive device: Rolling walker (2 wheels)         General Gait Details: Pt with just a few slow, guarded steps and then was able to attain consistent cadence/speed with minimal UE reliance on the walker  Stairs            Wheelchair Mobility    Modified Rankin (Stroke Patients Only)       Balance Overall balance assessment: Modified Independent                                           Pertinent Vitals/Pain Pain Assessment: 0-10 Pain Score: 4  (minimal pain at rest)    Home Living Family/patient expects to be discharged to:: Private residence Living Arrangements: Spouse/significant other   Type of Home: House Home Access: Stairs to enter Entrance Stairs-Rails: Left Entrance Stairs-Number of Steps: 2   Home Layout: One level Home Equipment:  (may have an appropriate walker, husband to check)      Prior Function Prior Level of Function : Working/employed;Independent/Modified Independent                     Hand Dominance        Extremity/Trunk Assessment   Upper Extremity Assessment Upper Extremity Assessment: Overall WFL for tasks assessed    Lower Extremity Assessment Lower Extremity Assessment:  (expected post-op weakness, able to SLR)       Communication   Communication: No difficulties  Cognition Arousal/Alertness: Awake/alert Behavior  During Therapy: WFL for tasks assessed/performed Overall Cognitive Status: Within Functional Limits for tasks assessed                                          General Comments      Exercises Total Joint Exercises Ankle Circles/Pumps: AROM;10 reps Quad Sets: Strengthening;10 reps Short Arc Quad: 10 reps;Strengthening;AROM Heel Slides: AROM;10 reps Hip ABduction/ADduction: Strengthening;10 reps Straight Leg Raises: AROM;10 reps Knee Flexion: PROM;5 reps Goniometric ROM: 0-83   Assessment/Plan    PT Assessment Patient needs continued PT services   PT Problem List Decreased strength;Decreased range of motion;Decreased activity tolerance;Decreased balance;Decreased mobility;Decreased knowledge of use of DME;Decreased safety awareness;Pain       PT Treatment Interventions DME instruction;Gait training;Stair training;Functional mobility training;Therapeutic activities;Therapeutic exercise;Balance training;Neuromuscular re-education;Patient/family education    PT Goals (Current goals can be found in the Care Plan section)  Acute Rehab PT Goals Patient Stated Goal: go home PT Goal Formulation: With patient Time For Goal Achievement: 03/15/21 Potential to Achieve Goals: Good    Frequency BID   Barriers to discharge        Co-evaluation               AM-PAC PT "6 Clicks" Mobility  Outcome Measure Help needed turning from your back to your side while in a flat bed without using bedrails?: None Help needed moving from lying on your back to sitting on the side of a flat bed without using bedrails?: None Help needed moving to and from a bed to a chair (including a wheelchair)?: A Little Help needed standing up from a chair using your arms (e.g., wheelchair or bedside chair)?: A Little Help needed to walk in hospital room?: A Little Help needed climbing 3-5 steps with a railing? : A Little 6 Click Score: 20    End of Session Equipment Utilized During Treatment: Gait belt Activity Tolerance: Patient tolerated treatment well Patient left: in chair;with call bell/phone within reach;with nursing/sitter in room Nurse Communication: Mobility status PT Visit Diagnosis: Muscle weakness (generalized) (M62.81);Difficulty in walking, not elsewhere classified (R26.2);Pain Pain - Right/Left: Left Pain - part of body: Knee    Time: 1454-1535 PT Time Calculation (min) (ACUTE ONLY): 41 min   Charges:   PT Evaluation $PT Eval Low Complexity: 1 Low PT Treatments $Gait Training: 8-22 mins $Therapeutic Exercise: 8-22 mins         Malachi Pro, DPT 03/01/2021, 5:36 PM

## 2021-03-01 NOTE — Anesthesia Preprocedure Evaluation (Signed)
Anesthesia Evaluation  Patient identified by MRN, date of birth, ID band Patient awake    Reviewed: Allergy & Precautions, H&P , NPO status , Patient's Chart, lab work & pertinent test results, reviewed documented beta blocker date and time   Airway Mallampati: II   Neck ROM: full    Dental  (+) Teeth Intact   Pulmonary sleep apnea and Continuous Positive Airway Pressure Ventilation ,    Pulmonary exam normal        Cardiovascular Exercise Tolerance: Poor hypertension, On Medications negative cardio ROS Normal cardiovascular exam Rhythm:regular Rate:Normal     Neuro/Psych  Headaches, negative psych ROS   GI/Hepatic negative GI ROS, Neg liver ROS,   Endo/Other  diabetes, Well Controlled, Type 2, Oral Hypoglycemic AgentsHypothyroidism Morbid obesity  Renal/GU negative Renal ROS  negative genitourinary   Musculoskeletal   Abdominal   Peds  Hematology negative hematology ROS (+)   Anesthesia Other Findings Past Medical History: No date: Arthritis No date: Bronchitis No date: Diabetes mellitus without complication (HCC) No date: HBP (high blood pressure) No date: Headache     Comment:  migraines No date: High cholesterol No date: Hypothyroidism No date: Sleep apnea     Comment:  uses cpap No date: Thyroid condition Past Surgical History: No date: ABDOMINAL HYSTERECTOMY No date: BACK SURGERY     Comment:  lumbar 03/20/2001: BREAST CYST ASPIRATION; Left     Comment:  neg No date: CESAREAN SECTION No date: COLONOSCOPY No date: FOOT SURGERY; Left No date: GALLBLADDER SURGERY BMI    Body Mass Index: 48.09 kg/m     Reproductive/Obstetrics negative OB ROS                             Anesthesia Physical Anesthesia Plan  ASA: 3  Anesthesia Plan: Spinal   Post-op Pain Management:    Induction:   PONV Risk Score and Plan: 3  Airway Management Planned:   Additional Equipment:    Intra-op Plan:   Post-operative Plan:   Informed Consent: I have reviewed the patients History and Physical, chart, labs and discussed the procedure including the risks, benefits and alternatives for the proposed anesthesia with the patient or authorized representative who has indicated his/her understanding and acceptance.     Dental Advisory Given  Plan Discussed with: CRNA  Anesthesia Plan Comments:         Anesthesia Quick Evaluation

## 2021-03-01 NOTE — Op Note (Signed)
03/01/2021  11:57 AM  PATIENT:  [name]   MRN: [id number]  PRE-OPERATIVE DIAGNOSIS:  Primary localized osteoarthritis of left knee   POST-OPERATIVE DIAGNOSIS:  Same   PROCEDURE:  Procedure(s): Left TOTAL KNEE ARTHROPLASTY   SURGEON: Leitha Schuller, MD   ASSISTANTS: Cranston Neighbor, PA-C   ANESTHESIA:   spinal   EBL: 100 cc   BLOOD ADMINISTERED:none   DRAINS:  Incisional wound VAC     LOCAL MEDICATIONS USED:  MARCAINE    and OTHER Exparel morphine and Toradol   SPECIMEN:  No Specimen   DISPOSITION OF SPECIMEN:  N/A   COUNTS:  YES   TOURNIQUET:  65 minutes at 300 mm Hg   IMPLANTS: Medacta  GMK sphere system with left to femur, left 1 tibia and 12 mm insert.  Size 2 patella, all components cemented.   DICTATION: Reubin Milan Dictation   patient was brought to the operating room and spinal anesthesia was obtained.  After prepping and draping the left leg in sterile fashion, and after patient identification and timeout procedures were completed, tourniquet was raised  and midline skin incision was made followed by medial parapatellar arthrotomy with severe medial compartment osteoarthritis, severe patellofemoral arthritis and moderate lateral compartment arthritis, partial synovectomy was also carried out.   The ACL and PCL and fat pad were excised along with anterior horns of the meniscus. The proximal tibia cutting guide from  the [facility] system was applied and the proximal tibia cut carried out.  The distal femoral cut was carried out in a similar fashion     The 2 femoral cutting guide applied with anterior posterior and chamfer cuts made.  The posterior horns of the menisci were removed at this point.   Injection of the above medication was carried out after the femoral and tibial cuts were carried out.  The 1 baseplate trial was placed pinned into position and proximal tibial preparation carried out with drilling hand reaming and the keel punch, without a short stem because it  appeared to be impinging on the proximal tibia.  Followed by placement of the 2 femur and sizing the tibial insert size 12 millimeter gave the best fit with stability and full extension.  The distal femoral drill holes were made in the notch cut for the trochlear groove was then carried out with trials were then removed the patella was cut using the patellar cutting guide and it sized to a size 2 after drill holes have been made  The knee was irrigated with pulsatile lavage and the bony surfaces dried the tibial component was cemented into place first.  Excess cement was removed and the polyethylene insert placed with a torque screw placed with a torque screwdriver tightened.  The distal femoral component was placed and the knee was held in extension as the patellar button was clamped into place.  After the cement was set, excess cement was removed and the knee was again irrigated thoroughly thoroughly irrigated.  The tourniquet was let down and hemostasis checked with electrocautery. The arthrotomy was repaired with a heavy Quill suture,  followed by 3-0 V lock subcuticular closure, skin staples followed by incisional wound VAC and Polar Care.Marland Kitchen   PLAN OF CARE: Discharge to home after PACU   PATIENT DISPOSITION:  PACU - hemodynamically stable.

## 2021-03-01 NOTE — Transfer of Care (Signed)
Immediate Anesthesia Transfer of Care Note  Patient: [name]  Procedure(s) Performed: TOTAL KNEE ARTHROPLASTY (Left: Knee)  Patient Location: PACU  Anesthesia Type:Spinal  Level of Consciousness: awake, alert  and oriented  Airway & Oxygen Therapy: Patient Spontanous Breathing  Post-op Assessment: Report given to RN and Post -op Vital signs reviewed and stable  Post vital signs: Reviewed and stable  Last Vitals:  Vitals Value Taken Time  BP 95/46 03/01/21 1153  Temp    Pulse 81 03/01/21 1156  Resp 18 03/01/21 1156  SpO2 95 % 03/01/21 1156  Vitals shown include unvalidated device data.  Last Pain:  Vitals:   03/01/21 0831  TempSrc: Oral  PainSc: 5          Complications: No notable events documented.

## 2021-03-02 ENCOUNTER — Encounter: Payer: Self-pay | Admitting: Orthopedic Surgery

## 2021-03-02 DIAGNOSIS — M1712 Unilateral primary osteoarthritis, left knee: Secondary | ICD-10-CM | POA: Diagnosis not present

## 2021-03-02 LAB — CBC
HCT: 34.1 % — ABNORMAL LOW (ref 36.0–46.0)
Hemoglobin: 11.4 g/dL — ABNORMAL LOW (ref 12.0–15.0)
MCH: 28.3 pg (ref 26.0–34.0)
MCHC: 33.4 g/dL (ref 30.0–36.0)
MCV: 84.6 fL (ref 80.0–100.0)
Platelets: 221 10*3/uL (ref 150–400)
RBC: 4.03 MIL/uL (ref 3.87–5.11)
RDW: 13.1 % (ref 11.5–15.5)
WBC: 10.7 10*3/uL — ABNORMAL HIGH (ref 4.0–10.5)
nRBC: 0 % (ref 0.0–0.2)

## 2021-03-02 LAB — BASIC METABOLIC PANEL
Anion gap: 4 — ABNORMAL LOW (ref 5–15)
BUN: 11 mg/dL (ref 8–23)
CO2: 27 mmol/L (ref 22–32)
Calcium: 8.3 mg/dL — ABNORMAL LOW (ref 8.9–10.3)
Chloride: 101 mmol/L (ref 98–111)
Creatinine, Ser: 0.63 mg/dL (ref 0.44–1.00)
GFR, Estimated: >60 mL/min (ref >60)
Glucose, Bld: 130 mg/dL — ABNORMAL HIGH (ref 70–99)
Potassium: 4.3 mmol/L (ref 3.5–5.1)
Sodium: 132 mmol/L — ABNORMAL LOW (ref 135–145)

## 2021-03-02 LAB — GLUCOSE, CAPILLARY
Glucose-Capillary: 110 mg/dL — ABNORMAL HIGH (ref 70–99)
Glucose-Capillary: 112 mg/dL — ABNORMAL HIGH (ref 70–99)

## 2021-03-02 MED ORDER — HYDROCODONE-ACETAMINOPHEN 7.5-325 MG PO TABS
ORAL_TABLET | ORAL | Status: AC
  Administered 2021-03-02: 14:00:00 2 via ORAL
  Filled 2021-03-02: qty 2

## 2021-03-02 MED ORDER — HYDROCODONE-ACETAMINOPHEN 7.5-325 MG PO TABS: 1.0000 | ORAL_TABLET | ORAL | 0 refills | Status: DC | PRN

## 2021-03-02 MED ORDER — METHOCARBAMOL 500 MG PO TABS: 500.0000 mg | ORAL_TABLET | Freq: Four times a day (QID) | ORAL | 0 refills | Status: DC | PRN

## 2021-03-02 MED ORDER — DOCUSATE SODIUM 100 MG PO CAPS: 100.0000 mg | ORAL_CAPSULE | Freq: Two times a day (BID) | ORAL | 0 refills | Status: DC

## 2021-03-02 MED ORDER — ENOXAPARIN SODIUM 40 MG/0.4ML IJ SOSY
PREFILLED_SYRINGE | INTRAMUSCULAR | Status: AC
  Filled 2021-03-02: qty 0.4

## 2021-03-02 MED ORDER — TRAMADOL HCL 50 MG PO TABS: 50.0000 mg | ORAL_TABLET | Freq: Four times a day (QID) | ORAL | 0 refills | Status: DC | PRN

## 2021-03-02 MED ORDER — ENOXAPARIN SODIUM 40 MG/0.4ML IJ SOSY: 40.0000 mg | PREFILLED_SYRINGE | INTRAMUSCULAR | 0 refills | Status: DC

## 2021-03-02 MED ORDER — METHOCARBAMOL 500 MG PO TABS
ORAL_TABLET | ORAL | Status: AC
  Filled 2021-03-02: qty 1

## 2021-03-02 MED ORDER — MORPHINE SULFATE (PF) 4 MG/ML IV SOLN
INTRAVENOUS | Status: AC
  Administered 2021-03-02: 06:00:00 1 mg via INTRAVENOUS
  Filled 2021-03-02: qty 1

## 2021-03-02 MED ORDER — MORPHINE SULFATE (PF) 4 MG/ML IV SOLN
INTRAVENOUS | Status: AC
  Administered 2021-03-02: 04:00:00 1 mg via INTRAVENOUS
  Filled 2021-03-02: qty 1

## 2021-03-02 MED ORDER — MORPHINE SULFATE (PF) 4 MG/ML IV SOLN
INTRAVENOUS | Status: AC
  Administered 2021-03-02: 01:00:00 1 mg via INTRAVENOUS
  Filled 2021-03-02: qty 1

## 2021-03-02 MED ORDER — HYDROCODONE-ACETAMINOPHEN 7.5-325 MG PO TABS
ORAL_TABLET | ORAL | Status: AC
  Administered 2021-03-02: 02:00:00 2 via ORAL
  Filled 2021-03-02: qty 2

## 2021-03-02 MED ORDER — POLYETHYLENE GLYCOL 3350 17 G PO PACK: 17.0000 g | PACK | Freq: Every day | ORAL | 0 refills | Status: DC | PRN

## 2021-03-02 MED ORDER — TRAMADOL HCL 50 MG PO TABS
ORAL_TABLET | ORAL | Status: AC
  Administered 2021-03-02: 08:00:00 50 mg via ORAL
  Filled 2021-03-02: qty 1

## 2021-03-02 MED ORDER — BISACODYL 10 MG RE SUPP: 10.0000 mg | Freq: Every day | RECTAL | 0 refills | Status: DC | PRN

## 2021-03-02 MED ORDER — HYDROCODONE-ACETAMINOPHEN 7.5-325 MG PO TABS
ORAL_TABLET | ORAL | Status: AC
  Administered 2021-03-02: 09:00:00 2 via ORAL
  Filled 2021-03-02: qty 2

## 2021-03-02 MED ORDER — TRAMADOL HCL 50 MG PO TABS
ORAL_TABLET | ORAL | Status: AC
  Filled 2021-03-02: qty 1

## 2021-03-02 NOTE — Anesthesia Postprocedure Evaluation (Signed)
Anesthesia Post Note  Patient: [name]  Procedure(s) Performed: TOTAL KNEE ARTHROPLASTY (Left: Knee)  Patient location during evaluation: Nursing Unit Anesthesia Type: Spinal Level of consciousness: oriented and awake and alert Pain management: pain level controlled Vital Signs Assessment: post-procedure vital signs reviewed and stable Respiratory status: spontaneous breathing and respiratory function stable Cardiovascular status: blood pressure returned to baseline and stable Postop Assessment: no headache, no backache, no apparent nausea or vomiting and patient able to bend at knees Anesthetic complications: no   No notable events documented.   Last Vitals:  Vitals:   03/02/21 0348 03/02/21 0715  BP: 135/74 123/63  Pulse: 95 91  Resp: 16 14  Temp: (!) 36.3 C 36.9 C  SpO2: 96% 94%    Last Pain:  Vitals:   03/02/21 0715  TempSrc:   PainSc: 7                  Ian Castagna B Alonza Smoker

## 2021-03-02 NOTE — Progress Notes (Signed)
° °  Subjective: 1 Day Post-Op Procedure(s) (LRB): TOTAL KNEE ARTHROPLASTY (Left) Patient reports pain as moderate.   Patient is well, and has had no acute complaints or problems Denies any CP, SOB, ABD pain. We will continue therapy today.  Plan is to go Home after hospital stay.  Objective: Vital signs in last 24 hours: Temp:  [96.8 F (36 C)-98.4 F (36.9 C)] 98.4 F (36.9 C) (12/14 0715) Pulse Rate:  [69-95] 91 (12/14 0715) Resp:  [13-20] 14 (12/14 0715) BP: (98-154)/(61-88) 123/63 (12/14 0715) SpO2:  [94 %-100 %] 94 % (12/14 0715) Weight:  [111.7 kg] 111.7 kg (12/13 0831)  Intake/Output from previous day: 12/13 0701 - 12/14 0700 In: 1930 [P.O.:120; I.V.:1600; IV Piggyback:210] Out: 1850 [Urine:1750; Blood:100] Intake/Output this shift: Total I/O In: -  Out: 650 [Urine:650]  Recent Labs    03/01/21 1618 03/02/21 0430  HGB 11.9* 11.4*   Recent Labs    03/01/21 1618 03/02/21 0430  WBC 14.0* 10.7*  RBC 4.19 4.03  HCT 35.8* 34.1*  PLT 234 221   Recent Labs    03/01/21 1618 03/02/21 0430  NA  --  132*  K  --  4.3  CL  --  101  CO2  --  27  BUN  --  11  CREATININE 0.78 0.63  GLUCOSE  --  130*  CALCIUM  --  8.3*   No results for input(s): LABPT, INR in the last 72 hours.  EXAM General - Patient is Alert, Appropriate, and Oriented Extremity - Neurovascular intact Sensation intact distally Intact pulses distally Dressing - dressing C/D/I and no drainage Motor Function - intact, moving foot and toes well on exam.   Past Medical History:  Diagnosis Date   Arthritis    Bronchitis    Diabetes mellitus without complication (HCC)    HBP (high blood pressure)    Headache    migraines   High cholesterol    Hypothyroidism    Sleep apnea    uses cpap   Thyroid condition     Assessment/Plan:   1 Day Post-Op Procedure(s) (LRB): TOTAL KNEE ARTHROPLASTY (Left) Principal Problem:   S/P TKR (total knee replacement) using cement, left Active  Problems:   Total knee replacement status, left  Estimated body mass index is 48.09 kg/m as calculated from the following:   Height as of this encounter: 5' (1.524 m).   Weight as of this encounter: 111.7 kg. Advance diet Up with therapy Labs and VSS Pain moderate, continue with current regimen Cm to assist with discharge to home with HHPT pending completion of PT goals  DVT Prophylaxis - Lovenox, Foot Pumps, and TED hose Weight-Bearing as tolerated to left leg   T. Cranston Neighbor, PA-C [facility] 03/02/2021, 8:22 AM

## 2021-03-02 NOTE — Discharge Instructions (Signed)

## 2021-03-02 NOTE — Progress Notes (Signed)
Dr. Rosita Kea in to assess patient.  Pt complained she felt a "pop" in her left knee during the night with increase pain. Dr. Rosita Kea aware, patient able to straighten and bend left leg. Foley removed.

## 2021-03-02 NOTE — Evaluation (Signed)
Occupational Therapy Evaluation Patient Details Name: [name] MRN: [id number] DOB: Oct 11, 1955 Today's Date: 03/02/2021   History of Present Illness 65 y/o female s/p L TKA 03/01/21.   Clinical Impression   Pt is agreeable to OT intervention. Upon entering the room, pt supine in bed and reports feeling very sleepy likely from medications. Pt performs bed mobility without assistance but needs increased time. Once seated on EOB, she reports feeling unwell and dizzy. OT educated pt on LB self care and polar care with pt verbalizing understanding and asking appropriate questions. Paper handout was provided as well. Pt transitions to PT session with no further concerns.    Recommendations for follow up therapy are one component of a multi-disciplinary discharge planning process, led by the attending physician.  Recommendations may be updated based on patient status, additional functional criteria and insurance authorization.   Follow Up Recommendations  No OT follow up    Assistance Recommended at Discharge PRN  Functional Status Assessment  Patient has had a recent decline in their functional status and demonstrates the ability to make significant improvements in function in a reasonable and predictable amount of time.  Equipment Recommendations  BSC/3in1       Precautions / Restrictions Precautions Precautions: Fall Restrictions Weight Bearing Restrictions: Yes LLE Weight Bearing: Weight bearing as tolerated      Mobility Bed Mobility Overal bed mobility: Modified Independent             General bed mobility comments: increased time and HOB elevated    Transfers                   General transfer comment: Pt reports feeling dizzy and unwell once EOB. Per chart review, she has been mod I with sit <>stand.      Balance Overall balance assessment: Modified Independent                                         ADL either performed or  assessed with clinical judgement   ADL Overall ADL's : Needs assistance/impaired     Grooming: Wash/dry hands;Wash/dry face;Sitting;Supervision/safety               Lower Body Dressing: Minimal assistance;Sit to/from stand                       Vision Patient Visual Report: No change from baseline              Pertinent Vitals/Pain Pain Assessment: 0-10 Pain Score: 6  Pain Location: L knee Pain Descriptors / Indicators: Aching;Discomfort Pain Intervention(s): Limited activity within patient's tolerance;Premedicated before session;Repositioned;Patient requesting pain meds-RN notified     Hand Dominance Right   Extremity/Trunk Assessment Upper Extremity Assessment Upper Extremity Assessment: Overall WFL for tasks assessed   Lower Extremity Assessment Lower Extremity Assessment: Defer to PT evaluation       Communication Communication Communication: No difficulties   Cognition Arousal/Alertness: Awake/alert Behavior During Therapy: WFL for tasks assessed/performed Overall Cognitive Status: Within Functional Limits for tasks assessed                                                  Home Living Family/patient expects to be discharged to:: Private  residence Living Arrangements: Spouse/significant other Available Help at Discharge: Family;Available 24 hours/day Type of Home: House Home Access: Stairs to enter [employer] of Steps: 2 Entrance Stairs-Rails: Left Home Layout: One level     Bathroom Shower/Tub: Walk-in shower         Home Equipment: Agricultural consultant (2 wheels)          Prior Functioning/Environment Prior Level of Function : Working/employed;Independent/Modified Independent                        OT Problem List: Decreased strength;Decreased activity tolerance;Decreased range of motion;Impaired balance (sitting and/or standing);Decreased knowledge of precautions;Decreased knowledge of use  of DME or AE;Decreased safety awareness;Pain      OT Treatment/Interventions: Self-care/ADL training;Therapeutic exercise;Therapeutic activities;Energy conservation;Cognitive remediation/compensation;DME and/or AE instruction;Patient/family education;Balance training;Manual therapy    OT Goals(Current goals can be found in the care plan section) Acute Rehab OT Goals Patient Stated Goal: to return home OT Goal Formulation: With patient Time For Goal Achievement: 03/16/21 Potential to Achieve Goals: Good ADL Goals Pt Will Perform Grooming: with modified independence;standing Pt Will Perform Lower Body Dressing: with modified independence;sit to/from stand;with adaptive equipment Pt Will Transfer to Toilet: with modified independence;ambulating Pt Will Perform Toileting - Clothing Manipulation and hygiene: with modified independence;sit to/from stand  OT Frequency: Min 2X/week   Barriers to D/C:    none known at this time          AM-PAC OT "6 Clicks" Daily Activity     Outcome Measure Help from another person eating meals?: None Help from another person taking care of personal grooming?: None Help from another person toileting, which includes using toliet, bedpan, or urinal?: A Little Help from another person bathing (including washing, rinsing, drying)?: A Little Help from another person to put on and taking off regular upper body clothing?: None Help from another person to put on and taking off regular lower body clothing?: A Little 6 Click Score: 21   End of Session Equipment Utilized During Treatment: Rolling walker (2 wheels) Nurse Communication: Mobility status  Activity Tolerance: Patient limited by pain;Other (comment) (dizziness) Patient left: in bed;with call bell/phone within reach;with bed alarm set;Other (comment) (PT arrived for session)  OT Visit Diagnosis: Unsteadiness on feet (R26.81);Muscle weakness (generalized) (M62.81);Pain Pain - Right/Left: Left Pain -  part of body: Knee                Time: 9030-0923 OT Time Calculation (min): 16 min Charges:  OT General Charges $OT Visit: 1 Visit OT Evaluation $OT Eval Low Complexity: 1 Low OT Treatments $Therapeutic Activity: 8-22 mins  Jackquline Denmark, MS, OTR/L , CBIS ascom [phone]  03/02/21, 10:58 AM

## 2021-03-02 NOTE — Progress Notes (Signed)
Physical Therapy Treatment Patient Details Name: [name] MRN: [id number] DOB: 23-Mar-1955 Today's Date: 03/02/2021   History of Present Illness 65 y/o female s/p L TKA 03/01/21.    PT Comments    Pt continues to be very pleasant and eager to work with PT.  She did have increased pain today (reports proximal quad being particularly sore) but was able to participate well none-the-less.  She did have less flexion ROM/tolerance and struggled more with SLRs, but still did well for POD1 expectations.  She was also able to ambulate ~200 ft and negotiate up/down 4 steps with direct assist.  Pt doing well, approprite POD1 metrics.    Recommendations for follow up therapy are one component of a multi-disciplinary discharge planning process, led by the attending physician.  Recommendations may be updated based on patient status, additional functional criteria and insurance authorization.  Follow Up Recommendations  Follow physician's recommendations for discharge plan and follow up therapies     Assistance Recommended at Discharge Intermittent Supervision/Assistance  Equipment Recommendations  None recommended by PT    Recommendations for Other Services       Precautions / Restrictions Precautions Precautions: Fall Restrictions Weight Bearing Restrictions: Yes LLE Weight Bearing: Weight bearing as tolerated     Mobility  Bed Mobility Overal bed mobility:  (sitting EOB on arrival, recliner post session)             General bed mobility comments: increased time and HOB elevated    Transfers Overall transfer level: Modified independent Equipment used: Rolling walker (2 wheels)               General transfer comment: minimal cuing reminders for set up/hand placement but able to rise w/o assist    Ambulation/Gait Ambulation/Gait assistance: Supervision Gait Distance (Feet): 200 Feet Assistive device: Rolling walker (2 wheels)         General Gait Details:  Initially with slow guarded steps that consistently became more fluid and by the end of the effort she was showing consistent cadence and walker movement w/o WBing hesitation, etc   Stairs Stairs: Yes Stairs assistance: Supervision Stair Management: One rail Left;Sideways Number of Stairs: 4 General stair comments: Pt was able to ascend/descend sets w/o assist and only minimal need for cuing.  Safe and with good overall effort.   Wheelchair Mobility    Modified Rankin (Stroke Patients Only)       Balance Overall balance assessment: Modified Independent                                          Cognition Arousal/Alertness: Awake/alert Behavior During Therapy: WFL for tasks assessed/performed Overall Cognitive Status: Within Functional Limits for tasks assessed                                          Exercises Total Joint Exercises Ankle Circles/Pumps: AROM;10 reps Quad Sets: Strengthening;10 reps Short Arc Quad: 10 reps;Strengthening;AROM Heel Slides: AAROM;10 reps Hip ABduction/ADduction: Strengthening;10 reps Straight Leg Raises: AAROM;10 reps Knee Flexion: PROM;5 reps Goniometric ROM: 0-77    General Comments General comments (skin integrity, edema, etc.): Pt with more pain and stiffness today than on eval, but still showing good effort and eager to work with PT.  Did initiate PT earlier in the morning,  but after a few exercises we decided to get pain meds and for PT to return later, which seemed to work out well except for pt feeling a little drowsy (likely from meds) during session.      Pertinent Vitals/Pain Pain Assessment: 0-10 Pain Score: 6  Pain Location: L knee Pain Descriptors / Indicators: Aching;Discomfort Pain Intervention(s): Premedicated before session    Home Living Family/patient expects to be discharged to:: Private residence Living Arrangements: Spouse/significant other Available Help at Discharge:  Family;Available 24 hours/day Type of Home: House Home Access: Stairs to enter Entrance Stairs-Rails: Left Entrance Stairs-Number of Steps: 2   Home Layout: One level Home Equipment: Agricultural consultant (2 wheels)      Prior Function            PT Goals (current goals can now be found in the care plan section) Progress towards PT goals: Progressing toward goals    Frequency    BID      PT Plan Current plan remains appropriate    Co-evaluation              AM-PAC PT "6 Clicks" Mobility   Outcome Measure  Help needed turning from your back to your side while in a flat bed without using bedrails?: None Help needed moving from lying on your back to sitting on the side of a flat bed without using bedrails?: None Help needed moving to and from a bed to a chair (including a wheelchair)?: A Little Help needed standing up from a chair using your arms (e.g., wheelchair or bedside chair)?: A Little Help needed to walk in hospital room?: A Little Help needed climbing 3-5 steps with a railing? : A Little 6 Click Score: 20    End of Session Equipment Utilized During Treatment: Gait belt Activity Tolerance: Patient tolerated treatment well Patient left: in chair;with call bell/phone within reach Nurse Communication: Mobility status PT Visit Diagnosis: Muscle weakness (generalized) (M62.81);Difficulty in walking, not elsewhere classified (R26.2);Pain Pain - Right/Left: Left Pain - part of body: Knee     Time: 1005-1035 PT Time Calculation (min) (ACUTE ONLY): 30 min  Charges:  $Gait Training: 8-22 mins $Therapeutic Exercise: 8-22 mins                     Malachi Pro, DPT 03/02/2021, 12:19 PM

## 2021-03-02 NOTE — TOC Progression Note (Signed)
Transition of Care [facility]) - Progression Note    Patient Details  Name: [name] MRN: [id number] Date of Birth: December 30, 1955  Transition of [facility] CM/SW Contact  Marlowe Sax, RN Phone Number: 03/02/2021, 12:33 PM  Clinical Narrative:   Patient lives at home with her spouse, She has a RW at home, she was able to ambulate ~200 ft and negotiate up/down 4 steps with direct assist, I notified Dr Rosita Kea and Thayer Ohm the PA to let them know that the patient will not have HH PT, Unable to locate a [facility] agency to accept her with [employer], [employer] will bring a 3 in 1 to the bedside prior to DC         Expected Discharge Plan and Services                                                 Social Determinants of Health (SDOH) Interventions    Readmission Risk Interventions No flowsheet data found.

## 2021-03-02 NOTE — Discharge Summary (Signed)
Physician Discharge Summary  Patient ID: [name] MRN: [id number] DOB/AGE: [birth year] 65 y.o.  Admit date: 03/01/2021 Discharge date: 03/02/2021  Admission Diagnoses:  S/P TKR (total knee replacement) using cement, left [Z96.652] Total knee replacement status, left [Z96.652]   Discharge Diagnoses: Patient Active Problem List   Diagnosis Date Noted   S/P TKR (total knee replacement) using cement, left 03/01/2021   Total knee replacement status, left 03/01/2021    Past Medical History:  Diagnosis Date   Arthritis    Bronchitis    Diabetes mellitus without complication (HCC)    HBP (high blood pressure)    Headache    migraines   High cholesterol    Hypothyroidism    Sleep apnea    uses cpap   Thyroid condition      Transfusion: none   Consultants (if any):   Discharged Condition: Improved  Hospital Course: [name] is an 65 y.o. female who was admitted 03/01/2021 with a diagnosis of S/P TKR (total knee replacement) using cement, left and went to the operating room on 03/01/2021 and underwent the above named procedures.    Surgeries: Procedure(s): TOTAL KNEE ARTHROPLASTY on 03/01/2021 Patient tolerated the surgery well. Taken to PACU where she was stabilized and then transferred to the orthopedic floor.  Started on Lovenox 30 mg q 12 hrs. Foot pumps applied bilaterally at 80 mm. Heels elevated on bed with rolled towels. No evidence of DVT. Negative Homan. Physical therapy started on day #1 for gait training and transfer. OT started day #1 for ADL and assisted devices.  Patient's foley was d/c on day #1. Patient's IV  was d/c on day #1.  On post op day #1 patient was stable and ready for discharge to home with HHPT.    She was given perioperative antibiotics:  Anti-infectives (From admission, onward)    Start     Dose/Rate Route Frequency Ordered Stop   03/01/21 1430  ceFAZolin (ANCEF) IVPB 2g/100 mL premix        2 g 200 mL/hr over 30  Minutes Intravenous Every 6 hours 03/01/21 1429 03/01/21 2230   03/01/21 0808  ceFAZolin (ANCEF) 2-4 GM/100ML-% IVPB       Note to Pharmacy: Christene Slates W: cabinet override      03/01/21 0808 03/01/21 1010   03/01/21 0600  ceFAZolin (ANCEF) IVPB 2g/100 mL premix        2 g 200 mL/hr over 30 Minutes Intravenous On call to O.R. 03/01/21 0111 03/01/21 1010     .  She was given sequential compression devices, early ambulation, and Lovenox TEDs for DVT prophylaxis.  She benefited maximally from the hospital stay and there were no complications.    Recent vital signs:  Vitals:   03/02/21 0715 03/02/21 1120  BP: 123/63 114/61  Pulse: 91 85  Resp: 14   Temp: 98.4 F (36.9 C) (!) 97.3 F (36.3 C)  SpO2: 94% 97%    Recent laboratory studies:  Lab Results  Component Value Date   HGB 11.4 (L) 03/02/2021   HGB 11.9 (L) 03/01/2021   HGB 12.7 02/21/2021   Lab Results  Component Value Date   WBC 10.7 (H) 03/02/2021   PLT 221 03/02/2021   Lab Results  Component Value Date   INR 1.03 12/11/2010   Lab Results  Component Value Date   NA 132 (L) 03/02/2021   K 4.3 03/02/2021   CL 101 03/02/2021   CO2 27 03/02/2021   BUN 11 03/02/2021  CREATININE 0.63 03/02/2021   GLUCOSE 130 (H) 03/02/2021    Discharge Medications:   Allergies as of 03/02/2021   No Known Allergies      Medication List     STOP taking these medications    ibuprofen 200 MG tablet Commonly known as: ADVIL   naproxen sodium 220 MG tablet Commonly known as: ALEVE       TAKE these medications    albuterol 108 (90 Base) MCG/ACT inhaler Commonly known as: VENTOLIN HFA Inhale 1-2 puffs into the lungs every 6 (six) hours as needed for wheezing or shortness of breath.   bisacodyl 10 MG suppository Commonly known as: DULCOLAX Place 1 suppository (10 mg total) rectally daily as needed for moderate constipation.   buPROPion 150 MG 24 hr tablet Commonly known as: WELLBUTRIN XL Take 150 mg by  mouth every morning.   citalopram 20 MG tablet Commonly known as: CELEXA Take 30 mg by mouth every morning.   docusate sodium 100 MG capsule Commonly known as: COLACE Take 1 capsule (100 mg total) by mouth 2 (two) times daily.   enoxaparin 40 MG/0.4ML injection Commonly known as: LOVENOX Inject 0.4 mLs (40 mg total) into the skin daily for 14 days. Start taking on: March 03, 2021   HYDROcodone-acetaminophen 7.5-325 MG tablet Commonly known as: NORCO Take 1-2 tablets by mouth every 4 (four) hours as needed for severe pain (pain score 7-10).   levothyroxine 112 MCG tablet Commonly known as: SYNTHROID Take 112 mcg by mouth every morning.   lisinopril 10 MG tablet Commonly known as: ZESTRIL Take 10 mg by mouth every morning.   methocarbamol 500 MG tablet Commonly known as: ROBAXIN Take 1 tablet (500 mg total) by mouth every 6 (six) hours as needed for muscle spasms.   Ozempic (1 MG/DOSE) 4 MG/3ML Sopn Generic drug: Semaglutide (1 MG/DOSE) Inject 1 mg into the skin every Sunday.   polyethylene glycol 17 g packet Commonly known as: MIRALAX / GLYCOLAX Take 17 g by mouth daily as needed for mild constipation.   rosuvastatin 20 MG tablet Commonly known as: CRESTOR Take 20 mg by mouth every morning.   traMADol 50 MG tablet Commonly known as: ULTRAM Take 1 tablet (50 mg total) by mouth every 6 (six) hours as needed.               Durable Medical Equipment  (From admission, onward)           Start     Ordered   03/01/21 1429  DME Walker rolling  Once       Question Answer Comment  Walker: With 5 Inch Wheels   Patient needs a walker to treat with the following condition S/P TKR (total knee replacement) using cement, left      03/01/21 1429   03/01/21 1429  DME 3 n 1  Once        12 /13/22 1429   03/01/21 1429  DME Bedside commode  Once       Question:  Patient needs a bedside commode to treat with the following condition  Answer:  S/P TKR (total knee  replacement) using cement, left   03/01/21 1429            Diagnostic Studies: DG Knee 1-2 Views Left  Result Date: 03/01/2021 CLINICAL DATA:  Postop from left knee arthroplasty. EXAM: LEFT KNEE - 1-2 VIEW COMPARISON:  None. FINDINGS: Total knee arthroplasty is seen with all 3 components in expected position. No evidence of fracture  or dislocation. Postoperative soft tissue gas and overlying skin staples noted. IMPRESSION: Expected postoperative appearance of total knee arthroplasty. No evidence of fracture. Electronically Signed   By: Danae Orleans M.D.   On: 03/01/2021 12:29    Disposition:      Follow-up Information     Evon Slack, PA-C Follow up in 2 week(s).   Specialties: Orthopedic Surgery, Emergency Medicine Contact information: [address] [place] [address] [phone]                  Signed: Patience Musca 03/02/2021, 2:52 PM

## 2021-08-26 ENCOUNTER — Other Ambulatory Visit: Payer: Self-pay | Admitting: Internal Medicine

## 2021-08-26 DIAGNOSIS — Z1231 Encounter for screening mammogram for malignant neoplasm of breast: Secondary | ICD-10-CM

## 2021-09-27 ENCOUNTER — Ambulatory Visit
Admission: RE | Admit: 2021-09-27 | Discharge: 2021-09-27 | Disposition: A | Discharge status: Home / Self Care | Payer: Managed Care, Other (non HMO) | Source: Ambulatory Visit | Attending: Internal Medicine | Admitting: Internal Medicine

## 2021-09-27 DIAGNOSIS — Z1231 Encounter for screening mammogram for malignant neoplasm of breast: Secondary | ICD-10-CM | POA: Diagnosis present

## 2022-04-12 ENCOUNTER — Other Ambulatory Visit: Payer: Self-pay | Admitting: Physician Assistant

## 2022-04-12 ENCOUNTER — Ambulatory Visit
Admission: RE | Admit: 2022-04-12 | Discharge: 2022-04-12 | Disposition: A | Discharge status: Home / Self Care | Payer: Managed Care, Other (non HMO) | Source: Ambulatory Visit | Attending: Physician Assistant | Admitting: Physician Assistant

## 2022-04-12 ENCOUNTER — Other Ambulatory Visit
Admission: RE | Admit: 2022-04-12 | Discharge: 2022-04-12 | Disposition: A | Discharge status: Home / Self Care | Payer: Managed Care, Other (non HMO) | Source: Ambulatory Visit | Attending: Physician Assistant | Admitting: Physician Assistant

## 2022-04-12 DIAGNOSIS — R0602 Shortness of breath: Secondary | ICD-10-CM | POA: Diagnosis present

## 2022-04-12 DIAGNOSIS — U071 COVID-19: Secondary | ICD-10-CM | POA: Insufficient documentation

## 2022-04-12 DIAGNOSIS — J208 Acute bronchitis due to other specified organisms: Secondary | ICD-10-CM | POA: Diagnosis present

## 2022-04-12 DIAGNOSIS — R7989 Other specified abnormal findings of blood chemistry: Secondary | ICD-10-CM | POA: Insufficient documentation

## 2022-04-12 LAB — D-DIMER, QUANTITATIVE: D-Dimer, Quant: 0.58 ug/mL-FEU — ABNORMAL HIGH (ref 0.00–0.50)

## 2022-04-12 LAB — POCT I-STAT CREATININE: Creatinine, Ser: 0.9 mg/dL (ref 0.44–1.00)

## 2022-04-12 MED ORDER — IOHEXOL 350 MG/ML SOLN
75.0000 mL | Freq: Once | INTRAVENOUS | Status: AC | PRN
  Administered 2022-04-12: 75 mL via INTRAVENOUS

## 2022-05-31 ENCOUNTER — Other Ambulatory Visit (HOSPITAL_BASED_OUTPATIENT_CLINIC_OR_DEPARTMENT_OTHER): Payer: Self-pay

## 2022-05-31 ENCOUNTER — Other Ambulatory Visit: Payer: Self-pay

## 2022-05-31 MED ORDER — MOUNJARO 10 MG/0.5ML ~~LOC~~ SOAJ
10.0000 mg | SUBCUTANEOUS | 2 refills | Status: DC
  Filled 2022-05-31: qty 2, 28d supply, fill #0

## 2022-06-28 ENCOUNTER — Other Ambulatory Visit: Payer: Self-pay

## 2022-06-28 MED ORDER — MOUNJARO 12.5 MG/0.5ML ~~LOC~~ SOAJ
12.5000 mg | SUBCUTANEOUS | 2 refills | Status: DC
  Filled 2022-06-28: qty 2, 28d supply, fill #0
  Filled 2022-07-25: qty 2, 28d supply, fill #1
  Filled 2022-08-22: qty 2, 28d supply, fill #2

## 2022-07-17 ENCOUNTER — Other Ambulatory Visit: Payer: Self-pay | Admitting: Internal Medicine

## 2022-07-17 DIAGNOSIS — Z1231 Encounter for screening mammogram for malignant neoplasm of breast: Secondary | ICD-10-CM

## 2022-07-25 ENCOUNTER — Other Ambulatory Visit: Payer: Self-pay

## 2022-08-22 ENCOUNTER — Other Ambulatory Visit: Payer: Self-pay

## 2022-09-13 ENCOUNTER — Other Ambulatory Visit: Payer: Self-pay

## 2022-09-13 MED ORDER — MOUNJARO 12.5 MG/0.5ML ~~LOC~~ SOAJ
12.5000 mg | SUBCUTANEOUS | 2 refills | Status: DC
  Filled 2022-09-13 – 2022-09-20 (×2): qty 2, 28d supply, fill #0
  Filled 2022-10-18: qty 2, 28d supply, fill #1
  Filled 2022-11-13: qty 2, 28d supply, fill #2

## 2022-09-20 ENCOUNTER — Other Ambulatory Visit: Payer: Self-pay

## 2022-10-04 ENCOUNTER — Ambulatory Visit
Admission: RE | Admit: 2022-10-04 | Discharge: 2022-10-04 | Disposition: A | Discharge status: Home / Self Care | Payer: Managed Care, Other (non HMO) | Source: Ambulatory Visit | Attending: Internal Medicine | Admitting: Internal Medicine

## 2022-10-04 DIAGNOSIS — Z1231 Encounter for screening mammogram for malignant neoplasm of breast: Secondary | ICD-10-CM | POA: Diagnosis present

## 2022-10-18 ENCOUNTER — Other Ambulatory Visit: Payer: Self-pay

## 2022-11-28 ENCOUNTER — Other Ambulatory Visit: Payer: Self-pay

## 2022-11-28 MED ORDER — BUPROPION HCL ER (XL) 150 MG PO TB24: 150.0000 mg | ORAL_TABLET | Freq: Every day | ORAL | 1 refills | Status: DC

## 2022-11-28 MED ORDER — TRAZODONE HCL 50 MG PO TABS
50.0000 mg | ORAL_TABLET | Freq: Every evening | ORAL | 3 refills | Status: AC | PRN
  Filled 2022-11-29: qty 30, 30d supply, fill #0
  Filled 2022-12-18: qty 30, 30d supply, fill #1
  Filled 2023-03-04: qty 30, 30d supply, fill #2
  Filled 2023-07-29: qty 30, 30d supply, fill #3

## 2022-11-28 MED ORDER — HYDROCORT-PRAMOXINE (PERIANAL) 2.5-1 % EX CREA: 1.0000 | TOPICAL_CREAM | Freq: Two times a day (BID) | CUTANEOUS | 0 refills | Status: DC

## 2022-11-28 MED ORDER — VENLAFAXINE HCL ER 37.5 MG PO CP24: 37.5000 mg | ORAL_CAPSULE | Freq: Every day | ORAL | 1 refills | Status: DC

## 2022-11-28 MED ORDER — MOUNJARO 15 MG/0.5ML ~~LOC~~ SOAJ
15.0000 mg | SUBCUTANEOUS | 3 refills | Status: DC
  Filled 2022-12-11: qty 2, 28d supply, fill #0
  Filled 2023-01-09: qty 2, 28d supply, fill #1
  Filled 2023-02-04: qty 2, 28d supply, fill #2
  Filled 2023-03-04 – 2023-03-05 (×2): qty 2, 28d supply, fill #3

## 2022-11-28 MED ORDER — LEVOTHYROXINE SODIUM 88 MCG PO TABS: 88.0000 ug | ORAL_TABLET | Freq: Every day | ORAL | 1 refills | Status: DC

## 2022-11-28 MED ORDER — CITALOPRAM HYDROBROMIDE 20 MG PO TABS
30.0000 mg | ORAL_TABLET | Freq: Every day | ORAL | 1 refills | Status: DC
  Filled 2022-12-11: qty 135, 90d supply, fill #0
  Filled 2023-03-04: qty 45, 30d supply, fill #1

## 2022-11-28 MED ORDER — LISINOPRIL 10 MG PO TABS
10.0000 mg | ORAL_TABLET | Freq: Every day | ORAL | 1 refills | Status: DC
  Filled 2022-12-11: qty 90, 90d supply, fill #0
  Filled 2023-03-04: qty 30, 30d supply, fill #1

## 2022-11-28 MED ORDER — MOUNJARO 7.5 MG/0.5ML ~~LOC~~ SOAJ: 7.5000 mg | SUBCUTANEOUS | 1 refills | Status: DC

## 2022-11-28 MED ORDER — ROSUVASTATIN CALCIUM 20 MG PO TABS
20.0000 mg | ORAL_TABLET | Freq: Every day | ORAL | 1 refills | Status: DC
  Filled 2023-01-08: qty 90, 90d supply, fill #0
  Filled 2023-04-09: qty 30, 30d supply, fill #1

## 2022-11-29 ENCOUNTER — Other Ambulatory Visit: Payer: Self-pay

## 2022-12-11 ENCOUNTER — Other Ambulatory Visit: Payer: Self-pay

## 2022-12-11 MED ORDER — VENLAFAXINE HCL ER 37.5 MG PO CP24
37.5000 mg | ORAL_CAPSULE | Freq: Every day | ORAL | 4 refills | Status: DC
  Filled 2022-12-11: qty 30, 30d supply, fill #0
  Filled 2023-01-07: qty 30, 30d supply, fill #1
  Filled 2023-02-04: qty 30, 30d supply, fill #2
  Filled 2023-03-04: qty 30, 30d supply, fill #3
  Filled 2023-04-09: qty 30, 30d supply, fill #4

## 2022-12-11 MED ORDER — PANTOPRAZOLE SODIUM 20 MG PO TBEC
20.0000 mg | DELAYED_RELEASE_TABLET | Freq: Every day | ORAL | 0 refills | Status: DC
  Filled 2022-12-11: qty 30, 30d supply, fill #0

## 2022-12-12 ENCOUNTER — Other Ambulatory Visit: Payer: Self-pay

## 2022-12-13 ENCOUNTER — Other Ambulatory Visit: Payer: Self-pay

## 2022-12-13 MED ORDER — LEVOTHYROXINE SODIUM 88 MCG PO TABS
88.0000 ug | ORAL_TABLET | Freq: Every morning | ORAL | 1 refills | Status: DC
  Filled 2022-12-13: qty 30, 30d supply, fill #0
  Filled 2023-01-07: qty 30, 30d supply, fill #1

## 2022-12-18 ENCOUNTER — Other Ambulatory Visit: Payer: Self-pay

## 2023-01-07 ENCOUNTER — Other Ambulatory Visit: Payer: Self-pay

## 2023-01-08 ENCOUNTER — Other Ambulatory Visit: Payer: Self-pay

## 2023-01-08 MED ORDER — PANTOPRAZOLE SODIUM 20 MG PO TBEC
20.0000 mg | DELAYED_RELEASE_TABLET | Freq: Every day | ORAL | 0 refills | Status: DC
  Filled 2023-01-08: qty 30, 30d supply, fill #0

## 2023-01-09 ENCOUNTER — Other Ambulatory Visit: Payer: Self-pay

## 2023-02-04 ENCOUNTER — Other Ambulatory Visit: Payer: Self-pay

## 2023-02-05 ENCOUNTER — Other Ambulatory Visit: Payer: Self-pay

## 2023-02-05 MED ORDER — LEVOTHYROXINE SODIUM 88 MCG PO TABS
88.0000 ug | ORAL_TABLET | Freq: Every morning | ORAL | 1 refills | Status: DC
  Filled 2023-02-05: qty 30, 30d supply, fill #0
  Filled 2023-03-15: qty 30, 30d supply, fill #1

## 2023-03-04 ENCOUNTER — Other Ambulatory Visit: Payer: Self-pay

## 2023-03-05 ENCOUNTER — Other Ambulatory Visit: Payer: Self-pay

## 2023-03-05 MED ORDER — PANTOPRAZOLE SODIUM 20 MG PO TBEC
20.0000 mg | DELAYED_RELEASE_TABLET | Freq: Every day | ORAL | 0 refills | Status: DC
  Filled 2023-03-05: qty 30, 30d supply, fill #0

## 2023-03-27 ENCOUNTER — Other Ambulatory Visit: Payer: Self-pay

## 2023-03-27 MED ORDER — PANTOPRAZOLE SODIUM 20 MG PO TBEC
20.0000 mg | DELAYED_RELEASE_TABLET | Freq: Every day | ORAL | 0 refills | Status: DC
  Filled 2023-03-27: qty 30, 30d supply, fill #0

## 2023-04-02 ENCOUNTER — Other Ambulatory Visit: Payer: Self-pay

## 2023-04-02 MED ORDER — MOUNJARO 15 MG/0.5ML ~~LOC~~ SOAJ
SUBCUTANEOUS | 3 refills | Status: DC
  Filled 2023-04-02: qty 2, 28d supply, fill #0
  Filled 2023-05-03: qty 2, 28d supply, fill #1
  Filled 2023-06-04: qty 2, 28d supply, fill #2
  Filled 2023-07-03: qty 2, 28d supply, fill #3

## 2023-04-03 ENCOUNTER — Other Ambulatory Visit: Payer: Self-pay

## 2023-04-03 ENCOUNTER — Other Ambulatory Visit (HOSPITAL_BASED_OUTPATIENT_CLINIC_OR_DEPARTMENT_OTHER): Payer: Self-pay

## 2023-04-03 MED ORDER — DOXYCYCLINE HYCLATE 100 MG PO TABS
100.0000 mg | ORAL_TABLET | Freq: Two times a day (BID) | ORAL | 0 refills | Status: DC
  Filled 2023-04-03: qty 20, 10d supply, fill #0

## 2023-04-03 MED ORDER — PROMETHAZINE-DM 6.25-15 MG/5ML PO SYRP
5.0000 mL | ORAL_SOLUTION | Freq: Four times a day (QID) | ORAL | 0 refills | Status: DC | PRN
  Filled 2023-04-03: qty 120, 6d supply, fill #0

## 2023-04-03 MED ORDER — PREDNISONE 20 MG PO TABS
20.0000 mg | ORAL_TABLET | Freq: Two times a day (BID) | ORAL | 0 refills | Status: DC
  Filled 2023-04-03: qty 10, 5d supply, fill #0

## 2023-04-09 ENCOUNTER — Other Ambulatory Visit: Payer: Self-pay

## 2023-04-09 MED ORDER — CITALOPRAM HYDROBROMIDE 20 MG PO TABS
30.0000 mg | ORAL_TABLET | Freq: Every day | ORAL | 1 refills | Status: DC
  Filled 2023-04-09: qty 135, 90d supply, fill #0
  Filled 2023-07-16: qty 135, 90d supply, fill #1

## 2023-04-09 MED ORDER — LISINOPRIL 10 MG PO TABS
10.0000 mg | ORAL_TABLET | Freq: Every day | ORAL | 1 refills | Status: AC
  Filled 2023-04-09: qty 90, 90d supply, fill #0
  Filled 2023-07-16: qty 90, 90d supply, fill #1

## 2023-04-20 ENCOUNTER — Other Ambulatory Visit: Payer: Self-pay

## 2023-04-20 MED ORDER — LEVOTHYROXINE SODIUM 88 MCG PO TABS
88.0000 ug | ORAL_TABLET | Freq: Every morning | ORAL | 1 refills | Status: AC
  Filled 2023-04-20: qty 30, 30d supply, fill #0
  Filled 2023-05-16: qty 30, 30d supply, fill #1

## 2023-04-23 ENCOUNTER — Other Ambulatory Visit: Payer: Self-pay

## 2023-04-23 MED ORDER — PREDNISONE 20 MG PO TABS
ORAL_TABLET | ORAL | 0 refills | Status: DC
  Filled 2023-04-23: qty 15, 10d supply, fill #0

## 2023-04-23 MED ORDER — HYDROCOD POLI-CHLORPHE POLI ER 10-8 MG/5ML PO SUER
5.0000 mL | Freq: Two times a day (BID) | ORAL | 0 refills | Status: DC
  Filled 2023-04-23: qty 115, 12d supply, fill #0

## 2023-04-23 MED ORDER — AMOXICILLIN-POT CLAVULANATE 875-125 MG PO TABS
875.0000 mg | ORAL_TABLET | Freq: Two times a day (BID) | ORAL | 0 refills | Status: AC
  Filled 2023-04-23: qty 20, 10d supply, fill #0

## 2023-04-24 ENCOUNTER — Other Ambulatory Visit: Payer: Self-pay

## 2023-04-24 MED ORDER — IPRATROPIUM-ALBUTEROL 0.5-2.5 (3) MG/3ML IN SOLN
RESPIRATORY_TRACT | 0 refills | Status: DC
  Filled 2023-04-24: qty 90, 10d supply, fill #0

## 2023-04-24 MED ORDER — BUDESONIDE 1 MG/2ML IN SUSP
RESPIRATORY_TRACT | 0 refills | Status: DC
  Filled 2023-04-24: qty 60, 30d supply, fill #0

## 2023-04-24 MED ORDER — FUROSEMIDE 20 MG PO TABS
ORAL_TABLET | ORAL | 0 refills | Status: DC
  Filled 2023-04-24: qty 5, 5d supply, fill #0

## 2023-04-25 ENCOUNTER — Other Ambulatory Visit: Payer: Self-pay

## 2023-04-25 MED ORDER — PROMETHAZINE-DM 6.25-15 MG/5ML PO SYRP
ORAL_SOLUTION | ORAL | 0 refills | Status: AC
  Filled 2023-04-25: qty 120, 6d supply, fill #0

## 2023-04-28 ENCOUNTER — Emergency Department: Payer: Managed Care, Other (non HMO)

## 2023-04-28 ENCOUNTER — Other Ambulatory Visit: Payer: Self-pay

## 2023-04-28 ENCOUNTER — Observation Stay
Admission: EM | Admit: 2023-04-28 | Discharge: 2023-04-30 | Disposition: A | Discharge status: Home / Self Care | Payer: Managed Care, Other (non HMO) | Attending: Emergency Medicine | Admitting: Emergency Medicine

## 2023-04-28 DIAGNOSIS — Z6841 Body Mass Index (BMI) 40.0 and over, adult: Secondary | ICD-10-CM | POA: Insufficient documentation

## 2023-04-28 DIAGNOSIS — A419 Sepsis, unspecified organism: Secondary | ICD-10-CM | POA: Diagnosis not present

## 2023-04-28 DIAGNOSIS — Z794 Long term (current) use of insulin: Secondary | ICD-10-CM | POA: Diagnosis not present

## 2023-04-28 DIAGNOSIS — Z96652 Presence of left artificial knee joint: Secondary | ICD-10-CM | POA: Diagnosis not present

## 2023-04-28 DIAGNOSIS — I10 Essential (primary) hypertension: Secondary | ICD-10-CM | POA: Diagnosis not present

## 2023-04-28 DIAGNOSIS — R0602 Shortness of breath: Secondary | ICD-10-CM | POA: Insufficient documentation

## 2023-04-28 DIAGNOSIS — G4733 Obstructive sleep apnea (adult) (pediatric): Secondary | ICD-10-CM | POA: Diagnosis not present

## 2023-04-28 DIAGNOSIS — Z1152 Encounter for screening for COVID-19: Secondary | ICD-10-CM | POA: Insufficient documentation

## 2023-04-28 DIAGNOSIS — Z79899 Other long term (current) drug therapy: Secondary | ICD-10-CM | POA: Diagnosis not present

## 2023-04-28 DIAGNOSIS — J189 Pneumonia, unspecified organism: Principal | ICD-10-CM | POA: Diagnosis present

## 2023-04-28 DIAGNOSIS — E039 Hypothyroidism, unspecified: Secondary | ICD-10-CM | POA: Diagnosis not present

## 2023-04-28 DIAGNOSIS — E66813 Obesity, class 3: Secondary | ICD-10-CM | POA: Diagnosis present

## 2023-04-28 DIAGNOSIS — E119 Type 2 diabetes mellitus without complications: Secondary | ICD-10-CM | POA: Diagnosis not present

## 2023-04-28 DIAGNOSIS — R059 Cough, unspecified: Secondary | ICD-10-CM | POA: Insufficient documentation

## 2023-04-28 DIAGNOSIS — E669 Obesity, unspecified: Secondary | ICD-10-CM

## 2023-04-28 DIAGNOSIS — F32A Depression, unspecified: Secondary | ICD-10-CM | POA: Insufficient documentation

## 2023-04-28 DIAGNOSIS — R197 Diarrhea, unspecified: Secondary | ICD-10-CM | POA: Diagnosis not present

## 2023-04-28 DIAGNOSIS — E785 Hyperlipidemia, unspecified: Secondary | ICD-10-CM | POA: Diagnosis not present

## 2023-04-28 DIAGNOSIS — E876 Hypokalemia: Secondary | ICD-10-CM | POA: Diagnosis not present

## 2023-04-28 DIAGNOSIS — R0600 Dyspnea, unspecified: Secondary | ICD-10-CM | POA: Insufficient documentation

## 2023-04-28 LAB — CBC WITH DIFFERENTIAL/PLATELET
Abs Immature Granulocytes: 0.06 10*3/uL (ref 0.00–0.07)
Basophils Absolute: 0 10*3/uL (ref 0.0–0.1)
Basophils Relative: 0 %
Eosinophils Absolute: 0 10*3/uL (ref 0.0–0.5)
Eosinophils Relative: 0 %
HCT: 39 % (ref 36.0–46.0)
Hemoglobin: 12.8 g/dL (ref 12.0–15.0)
Immature Granulocytes: 1 %
Lymphocytes Relative: 18 %
Lymphs Abs: 2 10*3/uL (ref 0.7–4.0)
MCH: 27.1 pg (ref 26.0–34.0)
MCHC: 32.8 g/dL (ref 30.0–36.0)
MCV: 82.5 fL (ref 80.0–100.0)
Monocytes Absolute: 0.2 10*3/uL (ref 0.1–1.0)
Monocytes Relative: 2 %
Neutro Abs: 8.8 10*3/uL — ABNORMAL HIGH (ref 1.7–7.7)
Neutrophils Relative %: 79 %
Platelets: 221 10*3/uL (ref 150–400)
RBC: 4.73 MIL/uL (ref 3.87–5.11)
RDW: 14.3 % (ref 11.5–15.5)
WBC: 11.2 10*3/uL — ABNORMAL HIGH (ref 4.0–10.5)
nRBC: 0 % (ref 0.0–0.2)

## 2023-04-28 LAB — RESP PANEL BY RT-PCR (RSV, FLU A&B, COVID)  RVPGX2
Influenza A by PCR: NEGATIVE
Influenza B by PCR: NEGATIVE
Resp Syncytial Virus by PCR: NEGATIVE
SARS Coronavirus 2 by RT PCR: NEGATIVE

## 2023-04-28 LAB — COMPREHENSIVE METABOLIC PANEL
ALT: 30 U/L (ref 0–44)
AST: 39 U/L (ref 15–41)
Albumin: 3.6 g/dL (ref 3.5–5.0)
Alkaline Phosphatase: 89 U/L (ref 38–126)
Anion gap: 17 — ABNORMAL HIGH (ref 5–15)
BUN: 19 mg/dL (ref 8–23)
CO2: 19 mmol/L — ABNORMAL LOW (ref 22–32)
Calcium: 7.8 mg/dL — ABNORMAL LOW (ref 8.9–10.3)
Chloride: 102 mmol/L (ref 98–111)
Creatinine, Ser: 0.88 mg/dL (ref 0.44–1.00)
GFR, Estimated: >60 mL/min (ref >60)
Glucose, Bld: 164 mg/dL — ABNORMAL HIGH (ref 70–99)
Potassium: 2.5 mmol/L — CL (ref 3.5–5.1)
Sodium: 138 mmol/L (ref 135–145)
Total Bilirubin: 0.6 mg/dL (ref 0.0–1.2)
Total Protein: 6.7 g/dL (ref 6.5–8.1)

## 2023-04-28 LAB — LACTIC ACID, PLASMA
Lactic Acid, Venous: 4.2 mmol/L (ref 0.5–1.9)
Lactic Acid, Venous: 4.4 mmol/L (ref 0.5–1.9)
Lactic Acid, Venous: 6.8 mmol/L (ref 0.5–1.9)
Lactic Acid, Venous: 4.4 mmol/L (ref 0.5–1.9)

## 2023-04-28 LAB — TROPONIN I (HIGH SENSITIVITY): Troponin I (High Sensitivity): 5 ng/L (ref <18)

## 2023-04-28 LAB — PROCALCITONIN: Procalcitonin: <0.1 ng/mL

## 2023-04-28 LAB — PHOSPHORUS: Phosphorus: 1.7 mg/dL — ABNORMAL LOW (ref 2.5–4.6)

## 2023-04-28 LAB — GLUCOSE, CAPILLARY: Glucose-Capillary: 205 mg/dL — ABNORMAL HIGH (ref 70–99)

## 2023-04-28 LAB — MAGNESIUM: Magnesium: 1.8 mg/dL (ref 1.7–2.4)

## 2023-04-28 MED ORDER — INSULIN ASPART 100 UNIT/ML IJ SOLN
0.0000 [IU] | Freq: Every day | INTRAMUSCULAR | Status: DC
  Administered 2023-04-28: 2 [IU] via SUBCUTANEOUS
  Filled 2023-04-28: qty 1

## 2023-04-28 MED ORDER — LACTATED RINGERS IV SOLN: INTRAVENOUS | Status: AC

## 2023-04-28 MED ORDER — IPRATROPIUM-ALBUTEROL 0.5-2.5 (3) MG/3ML IN SOLN
3.0000 mL | Freq: Once | RESPIRATORY_TRACT | Status: AC
  Administered 2023-04-28: 3 mL via RESPIRATORY_TRACT
  Filled 2023-04-28: qty 3

## 2023-04-28 MED ORDER — DM-GUAIFENESIN ER 30-600 MG PO TB12
1.0000 | ORAL_TABLET | Freq: Two times a day (BID) | ORAL | Status: DC | PRN
  Administered 2023-04-29 – 2023-04-30 (×2): 1 via ORAL
  Filled 2023-04-28 (×2): qty 1

## 2023-04-28 MED ORDER — POTASSIUM PHOSPHATES 15 MMOLE/5ML IV SOLN
15.0000 mmol | Freq: Once | INTRAVENOUS | Status: AC
  Administered 2023-04-29: 15 mmol via INTRAVENOUS
  Filled 2023-04-28: qty 5

## 2023-04-28 MED ORDER — LACTATED RINGERS IV BOLUS
500.0000 mL | Freq: Once | INTRAVENOUS | Status: AC
  Administered 2023-04-28: 500 mL via INTRAVENOUS

## 2023-04-28 MED ORDER — POTASSIUM CHLORIDE CRYS ER 20 MEQ PO TBCR
40.0000 meq | EXTENDED_RELEASE_TABLET | Freq: Once | ORAL | Status: AC
  Administered 2023-04-28: 40 meq via ORAL
  Filled 2023-04-28: qty 2

## 2023-04-28 MED ORDER — SODIUM CHLORIDE 0.9 % IV SOLN
500.0000 mg | Freq: Once | INTRAVENOUS | Status: AC
  Administered 2023-04-28: 500 mg via INTRAVENOUS
  Filled 2023-04-28: qty 5

## 2023-04-28 MED ORDER — VENLAFAXINE HCL ER 37.5 MG PO CP24
37.5000 mg | ORAL_CAPSULE | Freq: Every day | ORAL | Status: DC
  Administered 2023-04-29 – 2023-04-30 (×2): 37.5 mg via ORAL
  Filled 2023-04-28 (×2): qty 1

## 2023-04-28 MED ORDER — POTASSIUM CHLORIDE 10 MEQ/100ML IV SOLN
10.0000 meq | INTRAVENOUS | Status: AC
  Administered 2023-04-28 (×3): 10 meq via INTRAVENOUS
  Filled 2023-04-28 (×2): qty 100

## 2023-04-28 MED ORDER — LACTATED RINGERS IV BOLUS
1000.0000 mL | Freq: Once | INTRAVENOUS | Status: AC
  Administered 2023-04-28: 1000 mL via INTRAVENOUS

## 2023-04-28 MED ORDER — PREDNISONE 20 MG PO TABS
40.0000 mg | ORAL_TABLET | Freq: Every day | ORAL | Status: DC
  Administered 2023-04-29 – 2023-04-30 (×2): 40 mg via ORAL
  Filled 2023-04-28 (×2): qty 2

## 2023-04-28 MED ORDER — LEVOTHYROXINE SODIUM 88 MCG PO TABS
88.0000 ug | ORAL_TABLET | Freq: Every morning | ORAL | Status: DC
  Administered 2023-04-29 – 2023-04-30 (×2): 88 ug via ORAL
  Filled 2023-04-28 (×2): qty 1

## 2023-04-28 MED ORDER — ACETAMINOPHEN 325 MG PO TABS: 650.0000 mg | ORAL_TABLET | Freq: Four times a day (QID) | ORAL | Status: DC | PRN

## 2023-04-28 MED ORDER — HYDROCODONE-ACETAMINOPHEN 7.5-325 MG PO TABS
1.0000 | ORAL_TABLET | ORAL | Status: DC | PRN
  Administered 2023-04-29: 1 via ORAL
  Filled 2023-04-28: qty 1

## 2023-04-28 MED ORDER — ENOXAPARIN SODIUM 60 MG/0.6ML IJ SOSY
0.5000 mg/kg | PREFILLED_SYRINGE | INTRAMUSCULAR | Status: DC
  Administered 2023-04-28 – 2023-04-29 (×2): 50 mg via SUBCUTANEOUS
  Filled 2023-04-28 (×2): qty 0.6

## 2023-04-28 MED ORDER — ALBUTEROL SULFATE (2.5 MG/3ML) 0.083% IN NEBU: 2.5000 mg | INHALATION_SOLUTION | RESPIRATORY_TRACT | Status: DC | PRN

## 2023-04-28 MED ORDER — TRAZODONE HCL 50 MG PO TABS: 50.0000 mg | ORAL_TABLET | Freq: Every evening | ORAL | Status: DC | PRN

## 2023-04-28 MED ORDER — CITALOPRAM HYDROBROMIDE 20 MG PO TABS
30.0000 mg | ORAL_TABLET | Freq: Every day | ORAL | Status: DC
  Administered 2023-04-29 – 2023-04-30 (×2): 30 mg via ORAL
  Filled 2023-04-28 (×2): qty 2

## 2023-04-28 MED ORDER — ENOXAPARIN SODIUM 40 MG/0.4ML IJ SOSY: 40.0000 mg | PREFILLED_SYRINGE | INTRAMUSCULAR | Status: DC

## 2023-04-28 MED ORDER — HYDRALAZINE HCL 20 MG/ML IJ SOLN: 5.0000 mg | INTRAMUSCULAR | Status: DC | PRN

## 2023-04-28 MED ORDER — ONDANSETRON HCL 4 MG/2ML IJ SOLN: 4.0000 mg | Freq: Three times a day (TID) | INTRAMUSCULAR | Status: DC | PRN

## 2023-04-28 MED ORDER — SODIUM CHLORIDE 0.9 % IV BOLUS: 500.0000 mL | Freq: Once | INTRAVENOUS | Status: DC

## 2023-04-28 MED ORDER — PANTOPRAZOLE SODIUM 20 MG PO TBEC
20.0000 mg | DELAYED_RELEASE_TABLET | Freq: Every day | ORAL | Status: DC
Start: 2023-04-29 — End: 2023-04-30
  Administered 2023-04-29 – 2023-04-30 (×2): 20 mg via ORAL
  Filled 2023-04-28 (×2): qty 1

## 2023-04-28 MED ORDER — INSULIN ASPART 100 UNIT/ML IJ SOLN
0.0000 [IU] | Freq: Three times a day (TID) | INTRAMUSCULAR | Status: DC
  Administered 2023-04-29 (×3): 1 [IU] via SUBCUTANEOUS
  Filled 2023-04-28 (×2): qty 1

## 2023-04-28 MED ORDER — PREDNISONE 20 MG PO TABS
40.0000 mg | ORAL_TABLET | Freq: Once | ORAL | Status: AC
  Administered 2023-04-28: 40 mg via ORAL
  Filled 2023-04-28: qty 2

## 2023-04-28 MED ORDER — IOHEXOL 350 MG/ML SOLN
75.0000 mL | Freq: Once | INTRAVENOUS | Status: AC | PRN
  Administered 2023-04-28: 75 mL via INTRAVENOUS

## 2023-04-28 MED ORDER — ROSUVASTATIN CALCIUM 20 MG PO TABS
20.0000 mg | ORAL_TABLET | Freq: Every day | ORAL | Status: DC
  Administered 2023-04-29 – 2023-04-30 (×2): 20 mg via ORAL
  Filled 2023-04-28 (×2): qty 1

## 2023-04-28 MED ORDER — SODIUM CHLORIDE 0.9 % IV SOLN
2.0000 g | INTRAVENOUS | Status: DC
  Administered 2023-04-29: 2 g via INTRAVENOUS
  Filled 2023-04-28 (×2): qty 20

## 2023-04-28 MED ORDER — SODIUM CHLORIDE 0.9 % IV SOLN
500.0000 mg | INTRAVENOUS | Status: DC
  Administered 2023-04-29: 500 mg via INTRAVENOUS
  Filled 2023-04-28 (×2): qty 5

## 2023-04-28 MED ORDER — SODIUM CHLORIDE 0.9 % IV SOLN
2.0000 g | Freq: Once | INTRAVENOUS | Status: AC
  Administered 2023-04-28: 2 g via INTRAVENOUS
  Filled 2023-04-28: qty 20

## 2023-04-28 MED ORDER — LISINOPRIL 10 MG PO TABS
10.0000 mg | ORAL_TABLET | Freq: Every day | ORAL | Status: DC
Start: 2023-04-29 — End: 2023-04-30
  Administered 2023-04-29 – 2023-04-30 (×2): 10 mg via ORAL
  Filled 2023-04-28 (×2): qty 1

## 2023-04-28 MED ORDER — IPRATROPIUM-ALBUTEROL 0.5-2.5 (3) MG/3ML IN SOLN
3.0000 mL | Freq: Four times a day (QID) | RESPIRATORY_TRACT | Status: DC
  Administered 2023-04-28 – 2023-04-29 (×2): 3 mL via RESPIRATORY_TRACT
  Filled 2023-04-28 (×2): qty 3

## 2023-04-28 NOTE — Sepsis Progress Note (Signed)
 Elink following code sepsis  Provider messaged lactic trend is 4.2>>4.4. currently 2 L fluid ordered, provide is basing off ideal body weight so no more fluids will be ordered. But he will order a 3rd lactic

## 2023-04-28 NOTE — Progress Notes (Signed)
 CODE SEPSIS - PHARMACY COMMUNICATION  **Broad Spectrum Antibiotics should be administered within 1 hour of Sepsis diagnosis**  Time Code Sepsis Called/Page Received: 17:51  Antibiotics Ordered: Ceftriaxone  and Azithromycin   Time of 1st antibiotic administration: Ceftriaxone  given at 18:09  Additional action taken by pharmacy: n/a  If necessary, Name of Provider/Nurse Contacted: n/a    Olam KANDICE Fritter ,PharmD Clinical Pharmacist  04/28/2023  6:15 PM

## 2023-04-28 NOTE — H&P (Addendum)
 History and Physical    [name] FMW:[id number] DOB: Jul 10, 1955 DOA: 04/28/2023  Referring MD/NP/PA:   PCP: Suzanne Bail, MD   Patient coming from:  The patient is coming from home.     Chief Complaint: SOB cough  HPI: [name] is a 68 y.o. female with medical history significant of HTN, HLD, DM, depression, hypothyroidism, OSA on CPAP, obesity, migraine, who presents with cough and shortness of breath.  Patient states that her symptoms have been going for more than 3 weeks.  She has cough and SOB. She was treated with 1 course of doxycycline  by PCP, without improvement, then switched to Augmentin , still without improvement.  20 mg of Lasix  daily was started 3 days ago, still without improvement. She continues to have productive cough with yellow-colored sputum production.  Her shortness of breath has been progressively worsening.  Patient states that she had low-grade fever at at the very beginning, but currently no fever or chills.  No chest pain. She started having diarrhea this morning, with 3 episode of watery diarrhea today.  No nausea, vomiting, abdominal pain.  No symptoms of UTI.   Data reviewed independently and ED Course: pt was found to have WBC 11.2, lactic acid 4.2 --> 4.4 --> 6.8, procalcitonin<0.10, negative PCR for COVID, flu and RSV, potassium 2.5, phosphorus 1.7, magnesium 1.8, GFR> 60, troponin 5.  Chest x-ray negative.  CTA negative for PE, but showed multifocal infiltration.  Patient is placed on telemetry bed for observation.  CTA: 1. No pulmonary embolism. 2. Mild to moderate patchy consolidation and ground-glass opacity in the left lower lobe. Minimal patchy centrilobular ground-glass opacity in the right upper lobe. Findings are compatible with multilobar infection. 3. Small hiatal hernia. 4.  Aortic Atherosclerosis (ICD10-I70.0).    EKG: I have personally reviewed.  Sinus rhythm, QTc 492, right bundle blockade, low voltage, early R wave  progression   Review of Systems:   General: no fevers, chills, no body weight gain, has fatigue HEENT: no blurry vision, hearing changes or sore throat Respiratory: has dyspnea, coughing, no wheezing CV: no chest pain, no palpitations GI: no nausea, vomiting, abdominal pain, has diarrhea, no constipation GU: no dysuria, burning on urination, increased urinary frequency, hematuria  Ext: no leg edema Neuro: no unilateral weakness, numbness, or tingling, no vision change or hearing loss Skin: no rash, no skin tear. MSK: No muscle spasm, no deformity, no limitation of range of movement in spin Heme: No easy bruising.  Travel history: No recent long distant travel.   Allergy:  Allergies  Allergen Reactions   Morphine  Itching   Tussicaps  [Hydrocod Poli-Chlorphe Poli Er] Rash    Past Medical History:  Diagnosis Date   Arthritis    Bronchitis    Diabetes mellitus without complication (HCC)    HBP (high blood pressure)    Headache    migraines   High cholesterol    Hypothyroidism    Sleep apnea    uses cpap   Thyroid condition     Past Surgical History:  Procedure Laterality Date   ABDOMINAL HYSTERECTOMY     BACK SURGERY     lumbar   BREAST CYST ASPIRATION Left 03/20/2001   neg   CESAREAN SECTION     COLONOSCOPY     FOOT SURGERY Left    GALLBLADDER SURGERY     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 She has never used smokeless tobacco. She reports current alcohol use. She reports that she does not use drugs.  Family History:  Family History  Problem Relation Age of Onset   Breast cancer Maternal Aunt 60     Prior to Admission medications   Medication Sig Start Date End Date Taking? Authorizing Provider  albuterol  (VENTOLIN  HFA) 108 (90 Base) MCG/ACT inhaler Inhale 1-2 puffs  into the lungs every 6 (six) hours as needed for wheezing or shortness of breath.    [provider]  amoxicillin -clavulanate (AUGMENTIN ) 875-125 MG tablet Take 1 tablet by mouth every 12 (twelve) hours for 10 days. 04/23/23     bisacodyl  (DULCOLAX) 10 MG suppository Place 1 suppository (10 mg total) rectally daily as needed for moderate constipation. 03/02/21   Charlene Debby BROCKS, PA-C  budesonide  (PULMICORT ) 1 MG/2ML nebulizer solution Inhale the contents of 1 vial (2 mLs (1 mg total)) by nebulization 2 (two) times daily for 10 days. 04/24/23     buPROPion  (WELLBUTRIN  XL) 150 MG 24 hr tablet Take 150 mg by mouth every morning. 07/16/15 03/01/21  [provider]  buPROPion  (WELLBUTRIN  XL) 150 MG 24 hr tablet Take 1 tablet (150 mg total) by mouth daily. 01/13/22     citalopram  (CELEXA ) 20 MG tablet Take 30 mg by mouth every morning.    [provider]  citalopram  (CELEXA ) 20 MG tablet Take 1.5 tablets (30 mg total) by mouth daily. 04/09/23     docusate sodium  (COLACE) 100 MG capsule Take 1 capsule (100 mg total) by mouth 2 (two) times daily. 03/02/21   Charlene Debby BROCKS, PA-C  doxycycline  (VIBRA -TABS) 100 MG tablet Take 1 tablet (100 mg total) by mouth 2 (two) times daily for 10 days. 04/03/23     enoxaparin  (LOVENOX ) 40 MG/0.4ML injection Inject 0.4 mLs (40 mg total) into the skin daily for 14 days. 03/03/21 03/17/21  Charlene Debby BROCKS, PA-C  furosemide  (LASIX ) 20 MG tablet Take 1 tablet (20 mg total) by mouth once daily for 5 days 04/24/23     HYDROcodone -acetaminophen  (NORCO) 7.5-325 MG tablet Take 1-2 tablets by mouth every 4 (four) hours as needed for severe pain (pain score 7-10). 03/02/21   Charlene Debby BROCKS, PA-C  hydrocortisone -pramoxine [facility]) 2.5-1 % rectal cream Place 1 Application rectally 2 (two) times daily for 14 days as directed 05/05/22     ipratropium-albuterol  (DUONEB) 0.5-2.5 (3) MG/3ML SOLN Inhale the contents of 1 vial (3 mLs) by nebulization 3 (three) times  daily for 10 days 04/24/23     levothyroxine  (SYNTHROID ) 112 MCG tablet Take 112 mcg by mouth every morning. 07/16/15   [provider]  levothyroxine  (SYNTHROID ) 88 MCG tablet Take 1 tablet (88 mcg total) by mouth every morning before breakfast (0630) ON EMPTY STOMACH WITH A GLASS OF WATER AT LEAST 30-60 MIN BEFORE BREAKFAST 04/20/23     lisinopril  (PRINIVIL ,ZESTRIL ) 10 MG tablet Take 10 mg by mouth every morning.    [provider]  lisinopril  (ZESTRIL ) 10 MG tablet Take 1 tablet (10 mg total) by mouth daily. 04/09/23     methocarbamol  (ROBAXIN ) 500 MG tablet Take 1 tablet (500 mg total) by mouth every 6 (six) hours as needed for muscle spasms. 03/02/21   Charlene Debby BROCKS, PA-C  OZEMPIC , 1 MG/DOSE, 4 MG/3ML SOPN Inject 1 mg into the skin every Sunday. 01/25/21   [provider]  pantoprazole  (PROTONIX ) 20 MG tablet Take  1 tablet (20 mg total) by mouth once daily 03/26/23     polyethylene glycol (MIRALAX  / GLYCOLAX ) 17 g packet Take 17 g by mouth daily as needed for mild constipation. 03/02/21   Charlene Debby BROCKS, PA-C  predniSONE  (DELTASONE ) 20 MG tablet Take 2 tablets (40 mg total) by mouth daily for 5 days, THEN 1 tablet (20 mg total) daily for 5 days. 04/23/23 05/03/23    promethazine -dextromethorphan  (PROMETHAZINE -DM) 6.25-15 MG/5ML syrup Take 5 mLs by mouth every 6 (six) hours as needed for 7 days 04/03/23     promethazine -dextromethorphan  (PROMETHAZINE -DM) 6.25-15 MG/5ML syrup Take 5 mLs by mouth every 6 (six) hours as needed for up to 7 days 04/25/23     rosuvastatin  (CRESTOR ) 20 MG tablet Take 20 mg by mouth every morning.    [provider]  rosuvastatin  (CRESTOR ) 20 MG tablet Take 1 tablet (20 mg total) by mouth daily. 04/25/22     tirzepatide  (MOUNJARO ) 10 MG/0.5ML Pen Inject 10 mg into the skin once a week. 04/12/22     tirzepatide  (MOUNJARO ) 15 MG/0.5ML Pen Inject 0.5 mLs (15 mg total) subcutaneously once a week 04/02/23     tirzepatide  (MOUNJARO ) 7.5 MG/0.5ML Pen  Inject 7.5 mg into the skin every 7 (seven) days. 01/13/22     traMADol  (ULTRAM ) 50 MG tablet Take 1 tablet (50 mg total) by mouth every 6 (six) hours as needed. 03/02/21   Charlene Debby BROCKS, PA-C  traZODone  (DESYREL ) 50 MG tablet Take 1 tablet (50 mg total) by mouth at bedtime as needed for sleep. 11/27/22     venlafaxine  XR (EFFEXOR -XR) 37.5 MG 24 hr capsule Take 1 capsule (37.5 mg total) by mouth daily. 08/29/22     venlafaxine  XR (EFFEXOR -XR) 37.5 MG 24 hr capsule Take 1 capsule (37.5 mg total) by mouth daily. 12/11/22       Physical Exam: Vitals:   04/28/23 1528 04/28/23 1540 04/28/23 1927 04/28/23 1945  BP: 137/67  (!) 166/76   Pulse: (!) 104 (!) 115 (!) 105 (!) 107  Resp: (!) 22 (!) 24 (!) 21 19  Temp: 98.1 F (36.7 C)  98.4 F (36.9 C)   TempSrc: Oral  Oral   SpO2: 98% 98% 99% 100%  Weight:   100.7 kg   Height:   5' (1.524 m)    General: Not in acute distress HEENT:       Eyes: PERRL, EOMI, no jaundice       ENT: No discharge from the ears and nose, no pharynx injection, no tonsillar enlargement.        Neck: No JVD, no bruit, no mass felt. Heme: No neck lymph node enlargement. Cardiac: S1/S2, RRR, No murmurs, No gallops or rubs. Respiratory: Has coarse breathing sound bilaterally, has crackles bilaterally GI: Soft, nondistended, nontender, no rebound pain, no organomegaly, BS present. GU: No hematuria Ext: No pitting leg edema bilaterally. 1+DP/PT pulse bilaterally. Musculoskeletal: No joint deformities, No joint redness or warmth, no limitation of ROM in spin. Skin: No rashes.  Neuro: Alert, oriented X3, cranial nerves II-XII grossly intact, moves all extremities normally. Psych: Patient is not psychotic, no suicidal or hemocidal ideation.  Labs on Admission: I have personally reviewed following labs and imaging studies  CBC: Recent Labs  Lab 04/28/23 1539  WBC 11.2*  NEUTROABS 8.8*  HGB 12.8  HCT 39.0  MCV 82.5  PLT 221   Basic Metabolic Panel: Recent Labs   Lab 04/28/23 1539 04/28/23 2007  NA 138  --   K  2.5*  --   CL 102  --   CO2 19*  --   GLUCOSE 164*  --   BUN 19  --   CREATININE 0.88  --   CALCIUM  7.8*  --   MG 1.8  --   PHOS  --  1.7*   GFR: Estimated Creatinine Clearance: 68 mL/min (by C-G formula based on SCr of 0.88 mg/dL). Liver Function Tests: Recent Labs  Lab 04/28/23 1539  AST 39  ALT 30  ALKPHOS 89  BILITOT 0.6  PROT 6.7  ALBUMIN 3.6   No results for input(s): LIPASE, AMYLASE in the last 168 hours. No results for input(s): AMMONIA in the last 168 hours. Coagulation Profile: No results for input(s): INR, PROTIME in the last 168 hours. Cardiac Enzymes: No results for input(s): CKTOTAL, CKMB, CKMBINDEX, TROPONINI in the last 168 hours. BNP (last 3 results) No results for input(s): PROBNP in the last 8760 hours. HbA1C: No results for input(s): HGBA1C in the last 72 hours. CBG: No results for input(s): GLUCAP in the last 168 hours. Lipid Profile: No results for input(s): CHOL, HDL, LDLCALC, TRIG, CHOLHDL, LDLDIRECT in the last 72 hours. Thyroid Function Tests: No results for input(s): TSH, T4TOTAL, FREET4, T3FREE, THYROIDAB in the last 72 hours. Anemia Panel: No results for input(s): VITAMINB12, FOLATE, FERRITIN, TIBC, IRON, RETICCTPCT in the last 72 hours. Urine analysis:    Component Value Date/Time   COLORURINE STRAW (A) 02/21/2021 1309   APPEARANCEUR HAZY (A) 02/21/2021 1309   LABSPEC 1.008 02/21/2021 1309   PHURINE 7.0 02/21/2021 1309   GLUCOSEU NEGATIVE 02/21/2021 1309   HGBUR NEGATIVE 02/21/2021 1309   BILIRUBINUR NEGATIVE 02/21/2021 1309   KETONESUR NEGATIVE 02/21/2021 1309   PROTEINUR NEGATIVE 02/21/2021 1309   NITRITE NEGATIVE 02/21/2021 1309   LEUKOCYTESUR NEGATIVE 02/21/2021 1309   Sepsis Labs: @LABRCNTIP (procalcitonin:4,lacticidven:4) ) Recent Results (from the past 240 hours)  Resp panel by RT-PCR (RSV, Flu A&B, Covid)  Anterior Nasal Swab     Status: None   Collection Time: 04/28/23  3:39 PM   Specimen: Anterior Nasal Swab  Result Value Ref Range Status   SARS Coronavirus 2 by RT PCR NEGATIVE NEGATIVE Final    Comment: (NOTE) SARS-CoV-2 target nucleic acids are NOT DETECTED.  The SARS-CoV-2 RNA is generally detectable in upper respiratory specimens during the acute phase of infection. The lowest concentration of SARS-CoV-2 viral copies this assay can detect is 138 copies/mL. A negative result does not preclude SARS-Cov-2 infection and should not be used as the sole basis for treatment or other patient management decisions. A negative result may occur with  improper specimen collection/handling, submission of specimen other than nasopharyngeal swab, presence of viral mutation(s) within the areas targeted by this assay, and inadequate number of viral copies(<138 copies/mL). A negative result must be combined with clinical observations, patient history, and epidemiological information. The expected result is Negative.  Fact Sheet for Patients:  bloggercourse.com  Fact Sheet for Healthcare Providers:  seriousbroker.it  This test is no t yet approved or cleared by the [place]  FDA and  has been authorized for detection and/or diagnosis of SARS-CoV-2 by FDA under an Emergency Use Authorization (EUA). This EUA will remain  in effect (meaning this test can be used) for the duration of the COVID-19 declaration under Section 564(b)(1) of the Act, 21 U.S.C.section 360bbb-3(b)(1), unless the authorization is terminated  or revoked sooner.       Influenza A by PCR NEGATIVE NEGATIVE Final   Influenza B by PCR  NEGATIVE NEGATIVE Final    Comment: (NOTE) The Xpert Xpress SARS-CoV-2/FLU/RSV plus assay is intended as an aid in the diagnosis of influenza from Nasopharyngeal swab specimens and should not be used as a sole basis for treatment. Nasal washings  and aspirates are unacceptable for Xpert Xpress SARS-CoV-2/FLU/RSV testing.  Fact Sheet for Patients: bloggercourse.com  Fact Sheet for Healthcare Providers: seriousbroker.it  This test is not yet approved or cleared by the [place]  FDA and has been authorized for detection and/or diagnosis of SARS-CoV-2 by FDA under an Emergency Use Authorization (EUA). This EUA will remain in effect (meaning this test can be used) for the duration of the COVID-19 declaration under Section 564(b)(1) of the Act, 21 U.S.C. section 360bbb-3(b)(1), unless the authorization is terminated or revoked.     Resp Syncytial Virus by PCR NEGATIVE NEGATIVE Final    Comment: (NOTE) Fact Sheet for Patients: bloggercourse.com  Fact Sheet for Healthcare Providers: seriousbroker.it  This test is not yet approved or cleared by the [place]  FDA and has been authorized for detection and/or diagnosis of SARS-CoV-2 by FDA under an Emergency Use Authorization (EUA). This EUA will remain in effect (meaning this test can be used) for the duration of the COVID-19 declaration under Section 564(b)(1) of the Act, 21 U.S.C. section 360bbb-3(b)(1), unless the authorization is terminated or revoked.  Performed at [facility], [address], [place], [place] [address]      Radiological Exams on Admission:   Assessment/Plan Principal Problem:   Multifocal pneumonia Active Problems:   Hypothyroidism   HTN (hypertension)   HLD (hyperlipidemia)   Diabetes mellitus without complication (HCC)   Hypokalemia   Diarrhea   Depression   OSA (obstructive sleep apnea)   Obesity, Class III, BMI 40-49.9 (morbid obesity) (HCC)   Hypophosphatemia   Assessment and Plan:  Multifocal pneumonia: CT negative for PE, did showed multifocal infiltration.  Patient has elevated lactic acid of 4.2 --> 4.4 --> 6.8,  but no fever, has only mild leukocytosis with WBC 11.2.  Procalcitonin less than 0.10.  Clinically does not seem to have sepsis.  Elevated lactic acid is likely due to dehydration secondary to diarrhea and Lasix  use.  Patient does not have oxygen desaturation.  No acute respiratory distress.  - Place in tele bed for obs - IV Rocephin  and azithromycin  - Incentive spirometry - Mucinex  for cough  - Bronchodilators - Prednisone  40 mg daily - Urine legionella and S. pneumococcal antigen - Follow up blood culture x2, sputum culture - Check respiratory virus panel - Trend lactic acid level per sepsis protocol - IVF: total of 3.0 L of LR bolus in ED, followed by 125 mL per hour  Hypothyroidism Synthroid   HTN (hypertension) -Lisinopril  -IV hydralazine  as needed  HLD (hyperlipidemia) -Crestor   Diabetes mellitus without complication (HCC): Recent A1c 6.6, well-controlled.  Patient is taking Mounjaro  injection -SSI  Hypokalemia and hypophosphatemia: Potassium 2.5.  Phosphorus 1.7.  Magnesium 1.8 -Repleted potassium and phosphorus -Check phosphorus and magnesium level in AM  Diarrhea: Maybe due to amoxicillin  use, but need to rule out C. Difficile -Check C. difficile  Depression -Continue home medications  OSA (obstructive sleep apnea) -CPAP  Morbid obesity: Body weight 111.7 kg, BMI 48.08 -Encourage losing weight -Exercise and healthy diet       DVT ppx: SQ Lovenox   Code Status: Full code   Family Communication:  Yes, patient's husband at bed side.   Disposition Plan:  Anticipate discharge back to previous environment  Consults called:  none  Admission status and Level of care: Telemetry Medical:    for obs     Dispo: The patient is from: Home              Anticipated d/c is to: Home              Anticipated d/c date is: 1 day              Patient currently is not medically stable to d/c.    Severity of Illness:  The appropriate patient status for this  patient is OBSERVATION. Observation status is judged to be reasonable and necessary in order to provide the required intensity of service to ensure the patient's safety. The patient's presenting symptoms, physical exam findings, and initial radiographic and laboratory data in the context of their medical condition is felt to place them at decreased risk for further clinical deterioration. Furthermore, it is anticipated that the patient will be medically stable for discharge from the hospital within 2 midnights of admission.        Date of Service 04/28/2023    Caleb Exon [facility]   If 7PM-7AM, please contact night-coverage [email or web address] 04/28/2023, 9:25 PM

## 2023-04-28 NOTE — Progress Notes (Signed)
 PHARMACIST - PHYSICIAN COMMUNICATION  CONCERNING:  Enoxaparin  (Lovenox ) for DVT Prophylaxis    RECOMMENDATION: Patient was prescribed enoxaprin 40mg  q24 hours for VTE prophylaxis.   Filed Weights   04/28/23 1927  Weight: 100.7 kg (222 lb)    Body mass index is 43.36 kg/m.  Estimated Creatinine Clearance: 68 mL/min (by C-G formula based on SCr of 0.88 mg/dL).   Based on [facility] policy patient is candidate for enoxaparin  0.5mg /kg TBW SQ every 24 hours based on BMI being >30.  DESCRIPTION: Pharmacy has adjusted enoxaparin  dose per [facility] policy.  Patient is now receiving enoxaparin  50 mg every 24 hours    Olam KANDICE Fritter, PharmD Clinical Pharmacist  04/28/2023 7:39 PM

## 2023-04-28 NOTE — ED Provider Notes (Signed)
 [facility] Provider Note    Event Date/Time   First MD Initiated Contact with Patient 04/28/23 1634     (approximate)   History   Chief Complaint Shortness of Breath   HPI  [name] is a 68 y.o. female with past medical history of hyperlipidemia, diabetes, and hypothyroidism who presents to the ED complaining of shortness of breath.  Patient reports that she has had a dry cough for the past few days, was diagnosed with bronchitis by her PCP and prescribed steroids as well as a nebulizer.  She states that this initially seem to be helping, but symptoms worsened since she got up this morning.  She reports increasing difficulty breathing and cough now productive of light yellow sputum.  She denies any fevers, describes feeling tight in her chest but has no pain in the chest.  She has not had any pain or swelling in her legs.  She denies any history of asthma or COPD, does not smoke cigarettes.     Physical Exam   Triage Vital Signs: ED Triage Vitals  Encounter Vitals Group     BP 04/28/23 1528 137/67     Systolic BP Percentile --      Diastolic BP Percentile --      Pulse Rate 04/28/23 1528 (!) 104     Resp 04/28/23 1528 (!) 22     Temp 04/28/23 1528 98.1 F (36.7 C)     Temp Source 04/28/23 1528 Oral     SpO2 04/28/23 1528 98 %     Weight --      Height --      Head Circumference --      Peak Flow --      Pain Score 04/28/23 1529 0     Pain Loc --      Pain Education --      Exclude from Growth Chart --     Most recent vital signs: Vitals:   04/28/23 1528 04/28/23 1540  BP: 137/67   Pulse: (!) 104 (!) 115  Resp: (!) 22 (!) 24  Temp: 98.1 F (36.7 C)   SpO2: 98% 98%    Constitutional: Alert and oriented. Eyes: Conjunctivae are normal. Head: Atraumatic. Nose: No congestion/rhinnorhea. Mouth/Throat: Mucous membranes are moist.  Cardiovascular: Tachycardic, regular rhythm. Grossly normal heart sounds.  2+ radial pulses  bilaterally. Respiratory: Tachypneic with increased respiratory effort.  No retractions. Lungs with expiratory wheezing. Gastrointestinal: Soft and nontender. No distention. Musculoskeletal: No lower extremity tenderness nor edema.  Neurologic:  Normal speech and language. No gross focal neurologic deficits are appreciated.    ED Results / Procedures / Treatments   Labs (all labs ordered are listed, but only abnormal results are displayed) Labs Reviewed  CBC WITH DIFFERENTIAL/PLATELET - Abnormal; Notable for the following components:      Result Value   WBC 11.2 (*)    Neutro Abs 8.8 (*)    All other components within normal limits  COMPREHENSIVE METABOLIC PANEL - Abnormal; Notable for the following components:   Potassium 2.5 (*)    CO2 19 (*)    Glucose, Bld 164 (*)    Calcium  7.8 (*)    Anion gap 17 (*)    All other components within normal limits  LACTIC ACID, PLASMA - Abnormal; Notable for the following components:   Lactic Acid, Venous 4.2 (*)    All other components within normal limits  LACTIC ACID, PLASMA - Abnormal; Notable for the following  components:   Lactic Acid, Venous 4.4 (*)    All other components within normal limits  RESP PANEL BY RT-PCR (RSV, FLU A&B, COVID)  RVPGX2  CULTURE, BLOOD (ROUTINE X 2)  CULTURE, BLOOD (ROUTINE X 2)  EXPECTORATED SPUTUM ASSESSMENT W GRAM STAIN, RFLX TO RESP C  RESPIRATORY PANEL BY PCR  C DIFFICILE QUICK SCREEN W PCR REFLEX    PROCALCITONIN  MAGNESIUM  HIV ANTIBODY (ROUTINE TESTING W REFLEX)  STREP PNEUMONIAE URINARY ANTIGEN  LEGIONELLA PNEUMOPHILA SEROGP 1 UR AG  BASIC METABOLIC PANEL  CBC  LACTIC ACID, PLASMA  LACTIC ACID, PLASMA  LACTIC ACID, PLASMA  PHOSPHORUS  TROPONIN I (HIGH SENSITIVITY)     EKG  ED ECG REPORT I, Carlin Palin, the attending physician, personally viewed and interpreted this ECG.   Date: 04/28/2023  EKG Time: 15:49  Rate: 100  Rhythm: sinus tachycardia  Axis: Normal  Intervals:right  bundle branch block  ST&T Change: None  RADIOLOGY Chest x-ray reviewed and interpreted by me with no infiltrate, edema, or effusion.  PROCEDURES:  Critical Care performed: Yes, see critical care procedure note(s)  .Critical Care  Performed by: Palin Carlin, MD Authorized by: Palin Carlin, MD   Critical care provider statement:    Critical care time (minutes):  30   Critical care time was exclusive of:  Separately billable procedures and treating other patients and teaching time   Critical care was necessary to treat or prevent imminent or life-threatening deterioration of the following conditions:  Sepsis   Critical care was time spent personally by me on the following activities:  Development of treatment plan with patient or surrogate, discussions with consultants, evaluation of patient's response to treatment, examination of patient, ordering and review of laboratory studies, ordering and review of radiographic studies, ordering and performing treatments and interventions, pulse oximetry, re-evaluation of patient's condition and review of old charts   I assumed direction of critical care for this patient from another provider in my specialty: no     Care discussed with: admitting provider      MEDICATIONS ORDERED IN ED: Medications  potassium chloride  10 mEq in 100 mL IVPB (10 mEq Intravenous New Bag/Given 04/28/23 1900)  azithromycin  (ZITHROMAX ) 500 mg in sodium chloride  0.9 % 250 mL IVPB (500 mg Intravenous New Bag/Given 04/28/23 1858)  HYDROcodone -acetaminophen  (NORCO) 7.5-325 MG per tablet 1-2 tablet (has no administration in time range)  lactated ringers  bolus 500 mL (has no administration in time range)  lactated ringers  infusion (has no administration in time range)  albuterol  (PROVENTIL ) (2.5 MG/3ML) 0.083% nebulizer solution 2.5 mg (has no administration in time range)  dextromethorphan -guaiFENesin  (MUCINEX  DM) 30-600 MG per 12 hr tablet 1 tablet (has no administration  in time range)  ondansetron  (ZOFRAN ) injection 4 mg (has no administration in time range)  hydrALAZINE  (APRESOLINE ) injection 5 mg (has no administration in time range)  acetaminophen  (TYLENOL ) tablet 650 mg (has no administration in time range)  insulin  aspart (novoLOG ) injection 0-9 Units (has no administration in time range)  insulin  aspart (novoLOG ) injection 0-5 Units (has no administration in time range)  enoxaparin  (LOVENOX ) injection 40 mg (has no administration in time range)  ipratropium-albuterol  (DUONEB) 0.5-2.5 (3) MG/3ML nebulizer solution 3 mL (has no administration in time range)  lactated ringers  bolus 1,000 mL (1,000 mLs Intravenous New Bag/Given 04/28/23 1735)  potassium chloride  SA (KLOR-CON  M) CR tablet 40 mEq (40 mEq Oral Given 04/28/23 1738)  ipratropium-albuterol  (DUONEB) 0.5-2.5 (3) MG/3ML nebulizer solution 3 mL (3 mLs Nebulization Given  04/28/23 1741)  predniSONE  (DELTASONE ) tablet 40 mg (40 mg Oral Given 04/28/23 1738)  iohexol  (OMNIPAQUE ) 350 MG/ML injection 75 mL (75 mLs Intravenous Contrast Given 04/28/23 1713)  cefTRIAXone  (ROCEPHIN ) 2 g in sodium chloride  0.9 % 100 mL IVPB (0 g Intravenous Stopped 04/28/23 1852)  lactated ringers  bolus 1,000 mL (1,000 mLs Intravenous New Bag/Given 04/28/23 1812)     IMPRESSION / MDM / ASSESSMENT AND PLAN / ED COURSE  I reviewed the triage vital signs and the nursing notes.                              68 y.o. female with past medical history of hyperlipidemia, diabetes, and hypothyroidism who presents to the ED complaining of productive cough with increasing difficulty breathing over the past few days, worse today.  Patient's presentation is most consistent with acute presentation with potential threat to life or bodily function.  Differential diagnosis includes, but is not limited to, ACS, PE, pneumonia, pneumothorax, bronchitis, COPD, COVID-19, influenza, anemia, electrolyte abnormality, AKI.  Patient nontoxic-appearing, does have  some mild respiratory distress with tachypnea and increased work of breathing, but maintaining oxygen saturations at 98% on room air.  She has some wheezing, took 20 mg of prednisone  earlier today and we will give an additional 40 mg along with a DuoNeb.  Chest x-ray is unremarkable, EKG shows no evidence of arrhythmia or ischemia but will add on troponin.  Given she has not been improving with treatment for bronchitis, now reports increasing difficulty breathing, we will further assess with CTA of her chest.  CTA chest is negative for PE, does show evidence of multifocal pneumonia.  Patient meets sepsis criteria and we will start on IV antibiotics, she was given 30 cc/kg of IV fluids for her ideal body weight of 45 kg.  On reassessment, patient remains tachycardic with improved work of breathing.  Case discussed with hospitalist for admission.      FINAL CLINICAL IMPRESSION(S) / ED DIAGNOSES   Final diagnoses:  Sepsis without acute organ dysfunction, due to unspecified organism [facility])  Community acquired pneumonia, unspecified laterality     Rx / DC Orders   ED Discharge Orders     None        Note:  This document was prepared using Dragon voice recognition software and may include unintentional dictation errors.   Willo Dunnings, MD 04/28/23 [id number]

## 2023-04-28 NOTE — ED Triage Notes (Addendum)
 Pt states she had bronchitis 3 weeks ago and is on antibiotics and cough medication, but states s/s got worse this last week and states cough has not improved. Pt c/o trouble breathing this morning. Pt AOX4, wet cough noted, coarse crackles noted bilaterally. Pt reports productive cough and air hunger. Pt has been using nebulizer at home without relief of s/s.

## 2023-04-28 NOTE — ED Notes (Signed)
 Pt in x-ray at this time

## 2023-04-29 ENCOUNTER — Other Ambulatory Visit: Payer: Self-pay

## 2023-04-29 DIAGNOSIS — A419 Sepsis, unspecified organism: Secondary | ICD-10-CM | POA: Diagnosis not present

## 2023-04-29 DIAGNOSIS — F32A Depression, unspecified: Secondary | ICD-10-CM

## 2023-04-29 DIAGNOSIS — J189 Pneumonia, unspecified organism: Secondary | ICD-10-CM

## 2023-04-29 DIAGNOSIS — R197 Diarrhea, unspecified: Secondary | ICD-10-CM

## 2023-04-29 DIAGNOSIS — E039 Hypothyroidism, unspecified: Secondary | ICD-10-CM

## 2023-04-29 DIAGNOSIS — E876 Hypokalemia: Secondary | ICD-10-CM

## 2023-04-29 DIAGNOSIS — E119 Type 2 diabetes mellitus without complications: Secondary | ICD-10-CM | POA: Diagnosis not present

## 2023-04-29 DIAGNOSIS — G4733 Obstructive sleep apnea (adult) (pediatric): Secondary | ICD-10-CM

## 2023-04-29 DIAGNOSIS — I10 Essential (primary) hypertension: Secondary | ICD-10-CM | POA: Diagnosis not present

## 2023-04-29 LAB — BLOOD GAS, ARTERIAL
Acid-base deficit: 2.1 mmol/L — ABNORMAL HIGH (ref 0.0–2.0)
Bicarbonate: 17.9 mmol/L — ABNORMAL LOW (ref 20.0–28.0)
O2 Saturation: 99.3 %
Patient temperature: 37
pCO2 arterial: 20 mm[Hg] — ABNORMAL LOW (ref 32–48)
pH, Arterial: 7.56 — ABNORMAL HIGH (ref 7.35–7.45)
pO2, Arterial: 108 mm[Hg] (ref 83–108)

## 2023-04-29 LAB — RESPIRATORY PANEL BY PCR

## 2023-04-29 LAB — GLUCOSE, CAPILLARY
Glucose-Capillary: 142 mg/dL — ABNORMAL HIGH (ref 70–99)
Glucose-Capillary: 131 mg/dL — ABNORMAL HIGH (ref 70–99)
Glucose-Capillary: 133 mg/dL — ABNORMAL HIGH (ref 70–99)
Glucose-Capillary: 155 mg/dL — ABNORMAL HIGH (ref 70–99)

## 2023-04-29 LAB — PHOSPHORUS: Phosphorus: 3.5 mg/dL (ref 2.5–4.6)

## 2023-04-29 LAB — MAGNESIUM: Magnesium: 2.2 mg/dL (ref 1.7–2.4)

## 2023-04-29 LAB — LACTIC ACID, PLASMA
Lactic Acid, Venous: 4.8 mmol/L (ref 0.5–1.9)
Lactic Acid, Venous: 3.1 mmol/L (ref 0.5–1.9)
Lactic Acid, Venous: 4.3 mmol/L (ref 0.5–1.9)

## 2023-04-29 LAB — STREP PNEUMONIAE URINARY ANTIGEN: Strep Pneumo Urinary Antigen: NEGATIVE

## 2023-04-29 LAB — CBC
HCT: 37.4 % (ref 36.0–46.0)
Hemoglobin: 12.7 g/dL (ref 12.0–15.0)
MCH: 26.8 pg (ref 26.0–34.0)
MCHC: 34 g/dL (ref 30.0–36.0)
MCV: 78.9 fL — ABNORMAL LOW (ref 80.0–100.0)
Platelets: 228 10*3/uL (ref 150–400)
RBC: 4.74 MIL/uL (ref 3.87–5.11)
RDW: 14.5 % (ref 11.5–15.5)
WBC: 8.7 10*3/uL (ref 4.0–10.5)
nRBC: 0 % (ref 0.0–0.2)

## 2023-04-29 LAB — C DIFFICILE QUICK SCREEN W PCR REFLEX
C Diff antigen: NEGATIVE
C Diff interpretation: NOT DETECTED
C Diff toxin: NEGATIVE

## 2023-04-29 LAB — BASIC METABOLIC PANEL
Anion gap: 13 (ref 5–15)
BUN: 15 mg/dL (ref 8–23)
CO2: 19 mmol/L — ABNORMAL LOW (ref 22–32)
Calcium: 8.6 mg/dL — ABNORMAL LOW (ref 8.9–10.3)
Chloride: 105 mmol/L (ref 98–111)
Creatinine, Ser: 0.66 mg/dL (ref 0.44–1.00)
GFR, Estimated: >60 mL/min (ref >60)
Glucose, Bld: 158 mg/dL — ABNORMAL HIGH (ref 70–99)
Potassium: 4.1 mmol/L (ref 3.5–5.1)
Sodium: 137 mmol/L (ref 135–145)

## 2023-04-29 LAB — PROCALCITONIN: Procalcitonin: <0.1 ng/mL

## 2023-04-29 LAB — HIV ANTIBODY (ROUTINE TESTING W REFLEX): HIV Screen 4th Generation wRfx: NONREACTIVE

## 2023-04-29 MED ORDER — IPRATROPIUM BROMIDE 0.02 % IN SOLN: 0.5000 mg | Freq: Four times a day (QID) | RESPIRATORY_TRACT | Status: DC | PRN

## 2023-04-29 MED ORDER — LEVALBUTEROL HCL 0.63 MG/3ML IN NEBU: 0.6300 mg | INHALATION_SOLUTION | Freq: Four times a day (QID) | RESPIRATORY_TRACT | Status: DC | PRN

## 2023-04-29 NOTE — Assessment & Plan Note (Signed)
 CT negative for PE, did showed multifocal infiltration, persistently elevated lactic acid although no significant hypoxia noted.  Procalcitonin remain negative.  Preliminary blood cultures negative.  Respiratory panel negative, RVP ordered and still pending. -Checking ABG to rule out any occult hypoxia -Continue with IV fluid -Monitor lactic acid until it normalized -Follow-up strep pneumo and Legionella labs -Continue with antibiotics -Continue with prednisone  -Continue with supportive care

## 2023-04-29 NOTE — Assessment & Plan Note (Signed)
 Recent A1c 6.6, well-controlled.  Patient is taking Mounjaro  injection -SSI

## 2023-04-29 NOTE — Plan of Care (Signed)

## 2023-04-29 NOTE — Assessment & Plan Note (Signed)
 -  CPAP at night

## 2023-04-29 NOTE — Progress Notes (Signed)
 Received call from telemetry monitoring team, reports patients heart rate has increased to 130's-140's and is trending back down to 105.  Charge nurse notified and responded to patient. Patient receiving nebulizer treatment from respiratory and having labs drawn. No acute distress. Respiratory reported plan to change nebulizer solution.

## 2023-04-29 NOTE — Assessment & Plan Note (Signed)
 Estimated body mass index is 43.36 kg/m as calculated from the following:   Height as of this encounter: 5' (1.524 m).   Weight as of this encounter: 100.7 kg.   -Complicate overall prognosis -Encouraged weight loss

## 2023-04-29 NOTE — Care Management Important Message (Signed)
 Important Message  Patient Details  Name: LILIYANA THOBE MRN: [id number] Date of Birth: [birth year]   Important Message Given:        Areta Beer, RN 04/29/2023, 4:19 PM

## 2023-04-29 NOTE — Progress Notes (Addendum)
 Sent the following message to provider:  Patient complaining of intermittent throbbing, radiating chest pain that began at rest. rates pain 4 out of 10. Vital signs stable. Patient having frequent cough. Medication administered for cough and pain. Would you like to order any other testing at this time? Patient is a 68 y.o. female with medical history significant of HTN, HLD, DM, depression, hypothyroidism, OSA on CPAP, obesity, migraine, who presents with cough and shortness of breath for 3 weeks. Multifocal pneumonia.   Received response from provider, ordered EKG

## 2023-04-29 NOTE — Assessment & Plan Note (Signed)
 Continue home medications

## 2023-04-29 NOTE — Progress Notes (Addendum)
 Entered patients room to perform assessment. Patient alert and orientedx4. No acute distress. Reports she has been having chest pain that began at rest. Patient describes the chest pain as an intermittent, radiating and throbbing. Patient rates pain at 4 out of 10 on a 0-10 scale. Vital signs stable.  Patient having frequent cough. Medication administered for cough and pain. See MAR.

## 2023-04-29 NOTE — Assessment & Plan Note (Signed)
-  Continue home lisinopril -As needed hydralazine

## 2023-04-29 NOTE — Plan of Care (Signed)
  Problem: Skin Integrity: Goal: Risk for impaired skin integrity will decrease Outcome: Progressing   Problem: Safety: Goal: Ability to remain free from injury will improve Outcome: Progressing   Problem: Respiratory: Goal: Ability to maintain adequate ventilation will improve Outcome: Progressing

## 2023-04-29 NOTE — Assessment & Plan Note (Signed)
-  Continue with home Synthroid 

## 2023-04-29 NOTE — Assessment & Plan Note (Signed)
 C. difficile negative.  Likely due to recent amoxicillin  use -Continue to monitor

## 2023-04-29 NOTE — Progress Notes (Signed)
 Patient states pain has improved with medication.

## 2023-04-29 NOTE — Assessment & Plan Note (Signed)
 Patient was found to have hypokalemia, hypophosphatemia and borderline magnesium on admission which are being repleted and improved. -Continue to monitor-replete as needed

## 2023-04-29 NOTE — Hospital Course (Addendum)
 Taken from H&P.  [name] is a 68 y.o. female with medical history significant of HTN, HLD, DM, depression, hypothyroidism, OSA on CPAP, obesity, migraine, who presents with cough and shortness of breath.  Ongoing symptoms for the past 3 weeks, treated with 1 course of doxycycline  by PCP, without improvement and then she was switched to Augmentin , still no improvement.  20 mg of Lasix  daily was also started 3 days ago.  On presentation vitals stable, labs with WBC 11.2, lactic acid 4.2 --> 4.4 --> 6.8, procalcitonin<0.10, negative PCR for COVID, flu and RSV, potassium 2.5, phosphorus 1.7, magnesium 1.8, GFR> 60, troponin 5.  Chest x-ray negative.  CTA negative for PE, but showed multifocal infiltration.   She was started on Rocephin  and Zithromax .  2/9: Vital stable, lactic acid remained elevated at 4.8, CO2 19, hypokalemia resolved with potassium at 4.1, leukocytosis resolved.  Checking ABG, continuing IV fluid.  Pending RVP, strep pneumo and Legionella labs. Procalcitonin remain negative.  2/10: Remained hemodynamically stable, on room air.  Lactic acidosis has been resolved.  ABG with mild respiratory alkalosis likely secondary to metabolic acidosis which has been resolved now.  Respiratory viral panel positive for metapneumovirus which is likely the cause of this viral pneumonia.  Chest seems clear but continued to have significant cough. C. difficile negative and diarrhea improved.  Patient had prolonged bronchitis likely secondary to metapneumovirus.  She was recently given a course of Augmentin  by PCP which she will complete.  She was also given 3 days of Zithromax  and will complete already started tapering course of prednisone .  She was instructed to use DuoNeb 3-4 times daily for next couple of days and then as needed.  Patient will continue the rest of her home medications and need to have a close follow-up with her providers for further assistance.

## 2023-04-29 NOTE — Progress Notes (Signed)
 Progress Note   Patient: [name] FMW:[id number] DOB: June 13, 1955 DOA: 04/28/2023     0 DOS: the patient was seen and examined on 04/29/2023   Brief hospital course: Taken from H&P.  [name] is a 68 y.o. female with medical history significant of HTN, HLD, DM, depression, hypothyroidism, OSA on CPAP, obesity, migraine, who presents with cough and shortness of breath.  Ongoing symptoms for the past 3 weeks, treated with 1 course of doxycycline  by PCP, without improvement and then she was switched to Augmentin , still no improvement.  20 mg of Lasix  daily was also started 3 days ago.  On presentation vitals stable, labs with WBC 11.2, lactic acid 4.2 --> 4.4 --> 6.8, procalcitonin<0.10, negative PCR for COVID, flu and RSV, potassium 2.5, phosphorus 1.7, magnesium 1.8, GFR> 60, troponin 5.  Chest x-ray negative.  CTA negative for PE, but showed multifocal infiltration.   She was started on Rocephin  and Zithromax .  2/9: Vital stable, lactic acid remained elevated at 4.8, CO2 19, hypokalemia resolved with potassium at 4.1, leukocytosis resolved.  Checking ABG, continuing IV fluid.  Pending RVP, strep pneumo and Legionella labs. Procalcitonin remain negative.    Assessment and Plan: * Multifocal pneumonia CT negative for PE, did showed multifocal infiltration, persistently elevated lactic acid although no significant hypoxia noted.  Procalcitonin remain negative.  Preliminary blood cultures negative.  Respiratory panel negative, RVP ordered and still pending. -Checking ABG to rule out any occult hypoxia -Continue with IV fluid -Monitor lactic acid until it normalized -Follow-up strep pneumo and Legionella labs -Continue with antibiotics -Continue with prednisone  -Continue with supportive care  Diabetes mellitus without complication (HCC) Recent A1c 6.6, well-controlled.  Patient is taking Mounjaro  injection -SSI  HTN (hypertension) -Continue home lisinopril  -As needed  hydralazine   Hypokalemia Patient was found to have hypokalemia, hypophosphatemia and borderline magnesium on admission which are being repleted and improved. -Continue to monitor-replete as needed  HLD (hyperlipidemia) -Continue Crestor   Hypothyroidism -Continue with home Synthroid   Diarrhea C. difficile negative.  Likely due to recent amoxicillin  use -Continue to monitor  Depression -Continue home medications  OSA (obstructive sleep apnea) -CPAP at night  Obesity, Class III, BMI 40-49.9 (morbid obesity) (HCC) Estimated body mass index is 43.36 kg/m as calculated from the following:   Height as of this encounter: 5' (1.524 m).   Weight as of this encounter: 100.7 kg.   -Complicate overall prognosis -Encouraged weight loss   Subjective: Patient continued to have significant cough.  No fever or chills.  Still having some diarrhea  Physical Exam: Vitals:   04/29/23 0101 04/29/23 0446 04/29/23 0722 04/29/23 1154  BP:  (!) 140/72 (!) 151/80 (!) 149/69  Pulse:  90 91 92  Resp:  18 18 18   Temp:  98 F (36.7 C)  98.3 F (36.8 C)  TempSrc:      SpO2: 95% 93% 94% 97%  Weight:      Height:       General.  Morbidly obese lady, in no acute distress. Pulmonary.  Few scattered rhonchi bilaterally, normal respiratory effort. CV.  Regular rate and rhythm, no JVD, rub or murmur. Abdomen.  Soft, nontender, nondistended, BS positive. CNS.  Alert and oriented .  No focal neurologic deficit. Extremities.  No edema, no cyanosis, pulses intact and symmetrical. Psychiatry.  Judgment and insight appears normal.   Data Reviewed: Prior data reviewed  Family Communication: Discussed with husband at bedside  Disposition: Status is: Observation The patient will require care spanning >  2 midnights and should be moved to inpatient because: Severity of illness  Planned Discharge Destination: Home  DVT prophylaxis.  Lovenox  Time spent: 50 minutes  This record has been created using  Conservation officer, historic buildings. Errors have been sought and corrected,but may not always be located. Such creation errors do not reflect on the standard of care.   Author: Amaryllis Dare, MD 04/29/2023 1:10 PM  For on call review [email or web address].

## 2023-04-29 NOTE — Assessment & Plan Note (Signed)
 Continue Crestor

## 2023-04-30 ENCOUNTER — Other Ambulatory Visit: Payer: Self-pay

## 2023-04-30 DIAGNOSIS — A419 Sepsis, unspecified organism: Secondary | ICD-10-CM | POA: Diagnosis not present

## 2023-04-30 DIAGNOSIS — I10 Essential (primary) hypertension: Secondary | ICD-10-CM | POA: Diagnosis not present

## 2023-04-30 DIAGNOSIS — J189 Pneumonia, unspecified organism: Secondary | ICD-10-CM | POA: Diagnosis not present

## 2023-04-30 DIAGNOSIS — E119 Type 2 diabetes mellitus without complications: Secondary | ICD-10-CM | POA: Diagnosis not present

## 2023-04-30 LAB — CBC
HCT: 36.6 % (ref 36.0–46.0)
Hemoglobin: 12.2 g/dL (ref 12.0–15.0)
MCH: 27 pg (ref 26.0–34.0)
MCHC: 33.3 g/dL (ref 30.0–36.0)
MCV: 81 fL (ref 80.0–100.0)
Platelets: 236 10*3/uL (ref 150–400)
RBC: 4.52 MIL/uL (ref 3.87–5.11)
RDW: 14.8 % (ref 11.5–15.5)
WBC: 11.8 10*3/uL — ABNORMAL HIGH (ref 4.0–10.5)
nRBC: 0 % (ref 0.0–0.2)

## 2023-04-30 LAB — RENAL FUNCTION PANEL
Albumin: 3.4 g/dL — ABNORMAL LOW (ref 3.5–5.0)
Anion gap: 10 (ref 5–15)
BUN: 20 mg/dL (ref 8–23)
CO2: 25 mmol/L (ref 22–32)
Calcium: 8.8 mg/dL — ABNORMAL LOW (ref 8.9–10.3)
Chloride: 104 mmol/L (ref 98–111)
Creatinine, Ser: 0.83 mg/dL (ref 0.44–1.00)
GFR, Estimated: >60 mL/min (ref >60)
Glucose, Bld: 96 mg/dL (ref 70–99)
Phosphorus: 4.6 mg/dL (ref 2.5–4.6)
Potassium: 4 mmol/L (ref 3.5–5.1)
Sodium: 139 mmol/L (ref 135–145)

## 2023-04-30 LAB — GLUCOSE, CAPILLARY
Glucose-Capillary: 91 mg/dL (ref 70–99)
Glucose-Capillary: 107 mg/dL — ABNORMAL HIGH (ref 70–99)

## 2023-04-30 LAB — LACTIC ACID, PLASMA: Lactic Acid, Venous: 1.4 mmol/L (ref 0.5–1.9)

## 2023-04-30 MED ORDER — IPRATROPIUM-ALBUTEROL 0.5-2.5 (3) MG/3ML IN SOLN
RESPIRATORY_TRACT | 0 refills | Status: AC
  Filled 2023-04-30: qty 90, 10d supply, fill #0

## 2023-04-30 MED ORDER — DM-GUAIFENESIN ER 60-1200 MG PO TB12
1.0000 | ORAL_TABLET | Freq: Two times a day (BID) | ORAL | 0 refills | Status: AC | PRN
  Filled 2023-04-30: qty 60, 30d supply, fill #0
  Filled 2023-04-30: qty 14, 7d supply, fill #0

## 2023-04-30 MED ORDER — AZITHROMYCIN 500 MG PO TABS
500.0000 mg | ORAL_TABLET | Freq: Every day | ORAL | 0 refills | Status: AC
  Filled 2023-04-30: qty 3, 3d supply, fill #0

## 2023-04-30 NOTE — Plan of Care (Signed)
   Problem: Coping: Goal: Ability to adjust to condition or change in health will improve Outcome: Progressing   Problem: Metabolic: Goal: Ability to maintain appropriate glucose levels will improve Outcome: Progressing   Problem: Education: Goal: Knowledge of General Education information will improve Description: Including pain rating scale, medication(s)/side effects and non-pharmacologic comfort measures Outcome: Progressing

## 2023-04-30 NOTE — Care Management Obs Status (Signed)
 MEDICARE OBSERVATION STATUS NOTIFICATION   Patient Details  Name: CEASIA DEFRAIN MRN: [id number] Date of Birth: [birth year]   Medicare Observation Status Notification Given:  Yes    Odilia Bennett, LCSW 04/30/2023, 9:30 AM

## 2023-04-30 NOTE — TOC CM/SW Note (Signed)
 Transition of Care [facility]) - Inpatient Brief Assessment   Patient Details  Name: [name] MRN: [id number] Date of Birth: [birth year]  [facility]) CM/SW Contact:    Odilia Bennett, LCSW Phone Number: 04/30/2023, 11:06 AM   Clinical Narrative: Patient has orders to discharge home today. Chart reviewed. No TOC needs identified. CSW signing off.  Transition of Care Asessment: Insurance and Status: Insurance coverage has been reviewed Patient has primary care physician: Yes Home environment has been reviewed: Single family home Prior level of function:: Not documented Prior/Current Home Services: No current home services Social Drivers of Health Review: SDOH reviewed no interventions necessary Readmission risk has been reviewed: Yes Transition of care needs: no transition of care needs at this time

## 2023-04-30 NOTE — Discharge Summary (Signed)
 Physician Discharge Summary   Patient: [name] MRN: [id number] DOB: 12/21/1955  Admit date:     04/28/2023  Discharge date: 04/30/23  Discharge Physician: Luna Salinas   PCP: Rex Castor, MD   Recommendations at discharge:  Please obtain CBC and BMP on follow-up Please ensure the completion of prednisone  and antibiotic Follow-up with primary care provider within a week Follow-up with pulmonology  Discharge Diagnoses: Principal Problem:   Multifocal pneumonia Active Problems:   Diabetes mellitus without complication (HCC)   HTN (hypertension)   Hypokalemia   HLD (hyperlipidemia)   Hypothyroidism   Diarrhea   Depression   OSA (obstructive sleep apnea)   Obesity, Class III, BMI 40-49.9 (morbid obesity) (HCC)   Hypophosphatemia   Sepsis without acute organ dysfunction [facility]   Hospital Course: Taken from H&P.  [name] is a 68 y.o. female with medical history significant of HTN, HLD, DM, depression, hypothyroidism, OSA on CPAP, obesity, migraine, who presents with cough and shortness of breath.  Ongoing symptoms for the past 3 weeks, treated with 1 course of doxycycline  by PCP, without improvement and then she was switched to Augmentin , still no improvement.  20 mg of Lasix  daily was also started 3 days ago.  On presentation vitals stable, labs with WBC 11.2, lactic acid 4.2 --> 4.4 --> 6.8, procalcitonin<0.10, negative PCR for COVID, flu and RSV, potassium 2.5, phosphorus 1.7, magnesium 1.8, GFR> 60, troponin 5.  Chest x-ray negative.  CTA negative for PE, but showed multifocal infiltration.   She was started on Rocephin  and Zithromax .  2/9: Vital stable, lactic acid remained elevated at 4.8, CO2 19, hypokalemia resolved with potassium at 4.1, leukocytosis resolved.  Checking ABG, continuing IV fluid.  Pending RVP, strep pneumo and Legionella labs. Procalcitonin remain negative.  2/10: Remained hemodynamically stable, on room air.  Lactic acidosis has  been resolved.  ABG with mild respiratory alkalosis likely secondary to metabolic acidosis which has been resolved now.  Respiratory viral panel positive for metapneumovirus which is likely the cause of this viral pneumonia.  Chest seems clear but continued to have significant cough. C. difficile negative and diarrhea improved.  Patient had prolonged bronchitis likely secondary to metapneumovirus.  She was recently given a course of Augmentin  by PCP which she will complete.  She was also given 3 days of Zithromax  and will complete already started tapering course of prednisone .  She was instructed to use DuoNeb 3-4 times daily for next couple of days and then as needed.  Patient will continue the rest of her home medications and need to have a close follow-up with her providers for further assistance.  Assessment and Plan: * Multifocal pneumonia CT negative for PE, did showed multifocal infiltration, persistently elevated lactic acid although no significant hypoxia noted.  Lactic acidosis now resolved.  Procalcitonin remain negative.  Preliminary blood cultures negative.  Respiratory panel negative, RVP positive for metapneumovirus. Continue with current antibiotics and steroid to complete the course. Supportive care  Diabetes mellitus without complication (HCC) Recent A1c 6.6, well-controlled.  Patient is taking Mounjaro  injection -SSI  HTN (hypertension) -Continue home lisinopril  -As needed hydralazine   Hypokalemia Patient was found to have hypokalemia, hypophosphatemia and borderline magnesium on admission which are being repleted and improved.  HLD (hyperlipidemia) -Continue Crestor   Hypothyroidism -Continue with home Synthroid   Diarrhea C. difficile negative.  Likely due to recent amoxicillin  use -Continue to monitor  Depression -Continue home medications  OSA (obstructive sleep apnea) -CPAP at night  Obesity, Class III, BMI  40-49.9 (morbid obesity) (HCC) Estimated body  mass index is 43.36 kg/m as calculated from the following:   Height as of this encounter: 5' (1.524 m).   Weight as of this encounter: 100.7 kg.   -Complicate overall prognosis -Encouraged weight loss  Consultants: None Procedures performed: None Disposition: Home Diet recommendation:  Discharge Diet Orders (From admission, onward)     Start     Ordered   04/30/23 0000  Diet - low sodium heart healthy        04/30/23 1050           Cardiac and Carb modified diet DISCHARGE MEDICATION: Allergies as of 04/30/2023       Reactions   Morphine  Itching   Tussicaps  [hydrocod Poli-chlorphe Poli Er] Rash        Medication List     STOP taking these medications    buPROPion  150 MG 24 hr tablet Commonly known as: WELLBUTRIN  XL       TAKE these medications    albuterol  108 (90 Base) MCG/ACT inhaler Commonly known as: VENTOLIN  HFA Inhale 1-2 puffs into the lungs every 6 (six) hours as needed for wheezing or shortness of breath.   amoxicillin -clavulanate 875-125 MG tablet Commonly known as: AUGMENTIN  Take 1 tablet by mouth every 12 (twelve) hours for 10 days.   azithromycin  500 MG tablet Commonly known as: Zithromax  Take 1 tablet (500 mg total) by mouth daily for 3 days.   budesonide  1 MG/2ML nebulizer solution Commonly known as: PULMICORT  Inhale the contents of 1 vial (2 mLs (1 mg total)) by nebulization 2 (two) times daily for 10 days.   citalopram  20 MG tablet Commonly known as: CELEXA  Take 1.5 tablets (30 mg total) by mouth daily.   dextromethorphan -guaiFENesin  30-600 MG 12hr tablet Commonly known as: MUCINEX  DM Take 1 tablet by mouth 2 (two) times daily as needed for cough.   furosemide  20 MG tablet Commonly known as: LASIX  Take 1 tablet (20 mg total) by mouth once daily for 5 days   ipratropium-albuterol  0.5-2.5 (3) MG/3ML Soln Commonly known as: DUONEB Inhale the contents of 1 vial (3 mLs) by nebulization 3 (three) times daily for 10 days    levothyroxine  88 MCG tablet Commonly known as: SYNTHROID  Take 1 tablet (88 mcg total) by mouth every morning before breakfast (0630) ON EMPTY STOMACH WITH A GLASS OF WATER AT LEAST 30-60 MIN BEFORE BREAKFAST   lisinopril  10 MG tablet Commonly known as: ZESTRIL  Take 1 tablet (10 mg total) by mouth daily.   Mounjaro  15 MG/0.5ML Pen Generic drug: tirzepatide  Inject 0.5 mLs (15 mg total) subcutaneously once a week   pantoprazole  20 MG tablet Commonly known as: PROTONIX  Take 1 tablet (20 mg total) by mouth once daily   predniSONE  20 MG tablet Commonly known as: DELTASONE  Take 2 tablets (40 mg total) by mouth daily for 5 days, THEN 1 tablet (20 mg total) daily for 5 days. Start taking on: April 23, 2023   promethazine -dextromethorphan  6.25-15 MG/5ML syrup Commonly known as: PROMETHAZINE -DM Take 5 mLs by mouth every 6 (six) hours as needed for up to 7 days   rosuvastatin  20 MG tablet Commonly known as: CRESTOR  Take 1 tablet (20 mg total) by mouth daily.   traZODone  50 MG tablet Commonly known as: DESYREL  Take 1 tablet (50 mg total) by mouth at bedtime as needed for sleep.   venlafaxine  XR 37.5 MG 24 hr capsule Commonly known as: EFFEXOR -XR Take 1 capsule (37.5 mg total) by mouth daily.  Follow-up Information     Rex Castor, MD Follow up.   Specialty: Internal Medicine Why: Hospital follow up Contact information: [address] [place] [address] [phone]                Discharge Exam: Cleavon Curls Weights   04/28/23 1927  Weight: 100.7 kg   General.  Obese lady, in no acute distress. Pulmonary.  Lungs clear bilaterally, normal respiratory effort. CV.  Regular rate and rhythm, no JVD, rub or murmur. Abdomen.  Soft, nontender, nondistended, BS positive. CNS.  Alert and oriented .  No focal neurologic deficit. Extremities.  No edema, no cyanosis, pulses intact and symmetrical. Psychiatry.  Judgment and insight appears normal.    Condition at discharge: stable  The results of significant diagnostics from this hospitalization (including imaging, microbiology, ancillary and laboratory) are listed below for reference.   Imaging Studies: CT Angio Chest PE W/Cm &/Or Wo Cm Result Date: 04/28/2023 CLINICAL DATA:  Cough, on antibiotic therapy, dyspnea. Pulmonary embolism (PE) suspected, high prob EXAM: CT ANGIOGRAPHY CHEST WITH CONTRAST TECHNIQUE: Multidetector CT imaging of the chest was performed using the standard protocol during bolus administration of intravenous contrast. Multiplanar CT image reconstructions and MIPs were obtained to evaluate the vascular anatomy. RADIATION DOSE REDUCTION: This exam was performed according to the departmental dose-optimization program which includes automated exposure control, adjustment of the mA and/or kV according to patient size and/or use of iterative reconstruction technique. CONTRAST:  75mL OMNIPAQUE  IOHEXOL  350 MG/ML SOLN COMPARISON:  Chest radiograph from earlier today. 04/12/2022 chest CT angiogram FINDINGS: Cardiovascular: The study is moderate quality for the evaluation of pulmonary embolism, with motion degradation. There are no filling defects in the central, lobar, segmental or subsegmental pulmonary artery branches to suggest acute pulmonary embolism. Atherosclerotic nonaneurysmal thoracic aorta. Normal caliber pulmonary arteries. Normal heart size. No significant pericardial fluid/thickening. Mediastinum/Nodes: No significant thyroid nodules. Unremarkable esophagus. No pathologically enlarged axillary, mediastinal or hilar lymph nodes. Lungs/Pleura: No pneumothorax. No pleural effusion. Mild to moderate patchy consolidation and ground-glass opacity in the left lower lobe. Minimal patchy centrilobular ground-glass opacity in the right upper lobe. No lung masses or significant pulmonary nodules. Upper abdomen: Small hiatal hernia.  Cholecystectomy. Musculoskeletal: No aggressive  appearing focal osseous lesions. Moderate thoracic spondylosis. Review of the MIP images confirms the above findings. IMPRESSION: 1. No pulmonary embolism. 2. Mild to moderate patchy consolidation and ground-glass opacity in the left lower lobe. Minimal patchy centrilobular ground-glass opacity in the right upper lobe. Findings are compatible with multilobar infection. 3. Small hiatal hernia. 4.  Aortic Atherosclerosis (ICD10-I70.0). Electronically Signed   By: Levell Reach M.D.   On: 04/28/2023 17:37   DG Chest 2 View Result Date: 04/28/2023 CLINICAL DATA:  Short of breath, bronchitis 3 weeks ago EXAM: CHEST - 2 VIEW COMPARISON:  04/05/2016 FINDINGS: Frontal and lateral views of the chest demonstrate an unremarkable cardiac silhouette. No acute airspace disease, effusion, or pneumothorax. No acute bony abnormalities. IMPRESSION: 1. No acute intrathoracic process. Electronically Signed   By: Bobbye Burrow M.D.   On: 04/28/2023 15:54    Microbiology: Results for orders placed or performed during the hospital encounter of 04/28/23  Resp panel by RT-PCR (RSV, Flu A&B, Covid) Anterior Nasal Swab     Status: None   Collection Time: 04/28/23  3:39 PM   Specimen: Anterior Nasal Swab  Result Value Ref Range Status   SARS Coronavirus 2 by RT PCR NEGATIVE NEGATIVE Final    Comment: (NOTE) SARS-CoV-2 target nucleic  acids are NOT DETECTED.  The SARS-CoV-2 RNA is generally detectable in upper respiratory specimens during the acute phase of infection. The lowest concentration of SARS-CoV-2 viral copies this assay can detect is 138 copies/mL. A negative result does not preclude SARS-Cov-2 infection and should not be used as the sole basis for treatment or other patient management decisions. A negative result may occur with  improper specimen collection/handling, submission of specimen other than nasopharyngeal swab, presence of viral mutation(s) within the areas targeted by this assay, and inadequate number  of viral copies(<138 copies/mL). A negative result must be combined with clinical observations, patient history, and epidemiological information. The expected result is Negative.  Fact Sheet for Patients:  BloggerCourse.com  Fact Sheet for Healthcare Providers:  SeriousBroker.it  This test is no t yet approved or cleared by the [place]  FDA and  has been authorized for detection and/or diagnosis of SARS-CoV-2 by FDA under an Emergency Use Authorization (EUA). This EUA will remain  in effect (meaning this test can be used) for the duration of the COVID-19 declaration under Section 564(b)(1) of the Act, 21 U.S.C.section 360bbb-3(b)(1), unless the authorization is terminated  or revoked sooner.       Influenza A by PCR NEGATIVE NEGATIVE Final   Influenza B by PCR NEGATIVE NEGATIVE Final    Comment: (NOTE) The Xpert Xpress SARS-CoV-2/FLU/RSV plus assay is intended as an aid in the diagnosis of influenza from Nasopharyngeal swab specimens and should not be used as a sole basis for treatment. Nasal washings and aspirates are unacceptable for Xpert Xpress SARS-CoV-2/FLU/RSV testing.  Fact Sheet for Patients: BloggerCourse.com  Fact Sheet for Healthcare Providers: SeriousBroker.it  This test is not yet approved or cleared by the [place]  FDA and has been authorized for detection and/or diagnosis of SARS-CoV-2 by FDA under an Emergency Use Authorization (EUA). This EUA will remain in effect (meaning this test can be used) for the duration of the COVID-19 declaration under Section 564(b)(1) of the Act, 21 U.S.C. section 360bbb-3(b)(1), unless the authorization is terminated or revoked.     Resp Syncytial Virus by PCR NEGATIVE NEGATIVE Final    Comment: (NOTE) Fact Sheet for Patients: BloggerCourse.com  Fact Sheet for Healthcare  Providers: SeriousBroker.it  This test is not yet approved or cleared by the [place]  FDA and has been authorized for detection and/or diagnosis of SARS-CoV-2 by FDA under an Emergency Use Authorization (EUA). This EUA will remain in effect (meaning this test can be used) for the duration of the COVID-19 declaration under Section 564(b)(1) of the Act, 21 U.S.C. section 360bbb-3(b)(1), unless the authorization is terminated or revoked.  Performed at [facility], [address] [place], [place] [address]   Culture, blood (routine x 2)     Status: None (Preliminary result)   Collection Time: 04/28/23  5:06 PM   Specimen: BLOOD  Result Value Ref Range Status   Specimen Description BLOOD BLOOD RIGHT ARM  Final   Special Requests   Final    BOTTLES DRAWN AEROBIC AND ANAEROBIC Blood Culture adequate volume   Culture   Final    NO GROWTH 2 DAYS Performed at [facility], [address] [place], [place] [address]    Report Status PENDING  Incomplete  Culture, blood (routine x 2)     Status: None (Preliminary result)   Collection Time: 04/28/23  5:06 PM   Specimen: BLOOD  Result Value Ref Range Status   Specimen Description BLOOD LEFT ANTECUBITAL  Final  Special Requests   Final    BOTTLES DRAWN AEROBIC AND ANAEROBIC Blood Culture adequate volume   Culture   Final    NO GROWTH 2 DAYS Performed at [facility], [address] [place], [place] [address]    Report Status PENDING  Incomplete  C Difficile Quick Screen w PCR reflex     Status: None   Collection Time: 04/29/23 10:40 AM   Specimen: STOOL  Result Value Ref Range Status   C Diff antigen NEGATIVE NEGATIVE Final   C Diff toxin NEGATIVE NEGATIVE Final   C Diff interpretation No C. difficile detected.  Final    Comment: Performed at [facility], [address] [place], [place] [address]  Respiratory (~20 pathogens) panel by PCR     Status: Abnormal    Collection Time: 04/29/23 12:00 PM   Specimen: Nasopharyngeal Swab; Respiratory  Result Value Ref Range Status   Adenovirus NOT DETECTED NOT DETECTED Final   Coronavirus 229E NOT DETECTED NOT DETECTED Final    Comment: (NOTE) The Coronavirus on the Respiratory Panel, DOES NOT test for the novel  Coronavirus (2019 nCoV)    Coronavirus HKU1 NOT DETECTED NOT DETECTED Final   Coronavirus NL63 NOT DETECTED NOT DETECTED Final   Coronavirus OC43 NOT DETECTED NOT DETECTED Final   Metapneumovirus DETECTED (A) NOT DETECTED Final   Rhinovirus / Enterovirus NOT DETECTED NOT DETECTED Final   Influenza A NOT DETECTED NOT DETECTED Final   Influenza B NOT DETECTED NOT DETECTED Final   Parainfluenza Virus 1 NOT DETECTED NOT DETECTED Final   Parainfluenza Virus 2 NOT DETECTED NOT DETECTED Final   Parainfluenza Virus 3 NOT DETECTED NOT DETECTED Final   Parainfluenza Virus 4 NOT DETECTED NOT DETECTED Final   Respiratory Syncytial Virus NOT DETECTED NOT DETECTED Final   Bordetella pertussis NOT DETECTED NOT DETECTED Final   Bordetella Parapertussis NOT DETECTED NOT DETECTED Final   Chlamydophila pneumoniae NOT DETECTED NOT DETECTED Final   Mycoplasma pneumoniae NOT DETECTED NOT DETECTED Final    Comment: Performed at [facility], [address], [place], [place] [address]    Labs: CBC: Recent Labs  Lab 04/28/23 1539 04/29/23 0456 04/30/23 0601  WBC 11.2* 8.7 11.8*  NEUTROABS 8.8*  --   --   HGB 12.8 12.7 12.2  HCT 39.0 37.4 36.6  MCV 82.5 78.9* 81.0  PLT 221 228 236   Basic Metabolic Panel: Recent Labs  Lab 04/28/23 1539 04/28/23 2007 04/29/23 0456 04/30/23 0601  NA 138  --  137 139  K 2.5*  --  4.1 4.0  CL 102  --  105 104  CO2 19*  --  19* 25  GLUCOSE 164*  --  158* 96  BUN 19  --  15 20  CREATININE 0.88  --  0.66 0.83  CALCIUM  7.8*  --  8.6* 8.8*  MG 1.8  --  2.2  --   PHOS  --  1.7* 3.5 4.6   Liver Function Tests: Recent Labs  Lab 04/28/23 1539 04/30/23 0601   AST 39  --   ALT 30  --   ALKPHOS 89  --   BILITOT 0.6  --   PROT 6.7  --   ALBUMIN 3.6 3.4*   CBG: Recent Labs  Lab 04/29/23 0723 04/29/23 1156 04/29/23 1643 04/29/23 2013 04/30/23 0744  GLUCAP 142* 131* 133* 155* 91    Discharge time spent: greater than 30 minutes.  This record has been created using [employer]  voice recognition software. Errors have been sought and corrected,but may not always be located. Such creation errors do not reflect on the standard of care.   Signed: Luna Salinas, MD [facility] 04/30/2023

## 2023-05-01 LAB — LEGIONELLA PNEUMOPHILA SEROGP 1 UR AG: L. pneumophila Serogp 1 Ur Ag: NEGATIVE

## 2023-05-03 ENCOUNTER — Other Ambulatory Visit: Payer: Self-pay

## 2023-05-03 LAB — CULTURE, BLOOD (ROUTINE X 2)
Culture: NO GROWTH
Culture: NO GROWTH
Special Requests: ADEQUATE
Special Requests: ADEQUATE

## 2023-05-03 MED ORDER — PROMETHAZINE-DM 6.25-15 MG/5ML PO SYRP
5.0000 mL | ORAL_SOLUTION | Freq: Four times a day (QID) | ORAL | 0 refills | Status: AC | PRN
  Filled 2023-05-03: qty 473, 24d supply, fill #0

## 2023-05-03 MED ORDER — PANTOPRAZOLE SODIUM 20 MG PO TBEC
20.0000 mg | DELAYED_RELEASE_TABLET | Freq: Every day | ORAL | 0 refills | Status: DC
  Filled 2023-05-03: qty 30, 30d supply, fill #0

## 2023-05-09 ENCOUNTER — Other Ambulatory Visit: Payer: Self-pay

## 2023-05-09 MED ORDER — METHYLPREDNISOLONE 4 MG PO TBPK
ORAL_TABLET | ORAL | 0 refills | Status: AC
  Filled 2023-05-09: qty 21, 6d supply, fill #0

## 2023-05-09 MED ORDER — FLUCONAZOLE 100 MG PO TABS
100.0000 mg | ORAL_TABLET | Freq: Every day | ORAL | 0 refills | Status: AC
  Filled 2023-05-09: qty 3, 3d supply, fill #0

## 2023-05-15 ENCOUNTER — Other Ambulatory Visit: Payer: Self-pay

## 2023-05-15 MED ORDER — CIPROFLOXACIN HCL 750 MG PO TABS
750.0000 mg | ORAL_TABLET | Freq: Two times a day (BID) | ORAL | 0 refills | Status: DC
  Filled 2023-05-15: qty 14, 7d supply, fill #0

## 2023-05-16 ENCOUNTER — Other Ambulatory Visit: Payer: Self-pay

## 2023-05-16 MED ORDER — PRECISION QID TEST VI STRP
ORAL_STRIP | 1 refills | Status: AC
Start: 2023-05-16 — End: ?
  Filled 2023-05-16: qty 100, 90d supply, fill #0

## 2023-05-16 MED ORDER — ROSUVASTATIN CALCIUM 20 MG PO TABS
20.0000 mg | ORAL_TABLET | Freq: Every day | ORAL | 3 refills | Status: AC
  Filled 2023-05-16: qty 90, 90d supply, fill #0
  Filled 2023-08-21: qty 90, 90d supply, fill #1
  Filled 2023-11-19: qty 90, 90d supply, fill #2
  Filled 2024-02-25: qty 90, 90d supply, fill #3

## 2023-05-16 MED ORDER — VENLAFAXINE HCL ER 37.5 MG PO CP24
37.5000 mg | ORAL_CAPSULE | Freq: Every day | ORAL | 4 refills | Status: DC
  Filled 2023-05-16: qty 30, 30d supply, fill #0
  Filled 2023-06-17: qty 30, 30d supply, fill #1
  Filled 2023-07-23: qty 30, 30d supply, fill #2
  Filled 2023-08-21: qty 30, 30d supply, fill #3
  Filled 2023-09-23: qty 30, 30d supply, fill #4

## 2023-05-16 MED ORDER — FREESTYLE LANCETS MISC
1 refills | Status: AC
  Filled 2023-05-16: qty 100, 50d supply, fill #0

## 2023-05-16 MED ORDER — LEVOTHYROXINE SODIUM 88 MCG PO TABS
88.0000 ug | ORAL_TABLET | Freq: Every morning | ORAL | 1 refills | Status: AC
  Filled 2023-05-16 – 2023-06-18 (×2): qty 30, 30d supply, fill #0
  Filled 2023-07-16: qty 30, 30d supply, fill #1

## 2023-05-16 MED ORDER — FREESTYLE FREEDOM LITE W/DEVICE KIT
1.0000 | PACK | 0 refills | Status: AC
  Filled 2023-05-16: qty 1, 30d supply, fill #0

## 2023-05-22 ENCOUNTER — Other Ambulatory Visit: Payer: Self-pay | Admitting: Internal Medicine

## 2023-05-22 DIAGNOSIS — Z1231 Encounter for screening mammogram for malignant neoplasm of breast: Secondary | ICD-10-CM

## 2023-06-04 ENCOUNTER — Other Ambulatory Visit: Payer: Self-pay

## 2023-06-04 MED ORDER — PANTOPRAZOLE SODIUM 20 MG PO TBEC
20.0000 mg | DELAYED_RELEASE_TABLET | Freq: Every day | ORAL | 0 refills | Status: DC
  Filled 2023-06-04: qty 30, 30d supply, fill #0

## 2023-06-17 ENCOUNTER — Other Ambulatory Visit: Payer: Self-pay

## 2023-06-18 ENCOUNTER — Other Ambulatory Visit: Payer: Self-pay

## 2023-06-18 MED ORDER — LEVOTHYROXINE SODIUM 88 MCG PO TABS
88.0000 ug | ORAL_TABLET | Freq: Every day | ORAL | 1 refills | Status: AC
Start: 2023-06-18 — End: ?

## 2023-06-19 ENCOUNTER — Other Ambulatory Visit: Payer: Self-pay

## 2023-06-19 MED ORDER — ROSUVASTATIN CALCIUM 20 MG PO TABS
20.0000 mg | ORAL_TABLET | Freq: Every day | ORAL | 3 refills | Status: AC
  Filled 2023-06-19: qty 90, 90d supply, fill #0

## 2023-06-19 MED ORDER — TRAZODONE HCL 50 MG PO TABS
50.0000 mg | ORAL_TABLET | Freq: Every evening | ORAL | 1 refills | Status: AC | PRN
  Filled 2023-06-19: qty 90, 90d supply, fill #0

## 2023-06-19 MED ORDER — VENLAFAXINE HCL ER 37.5 MG PO CP24
37.5000 mg | ORAL_CAPSULE | Freq: Every day | ORAL | 1 refills | Status: AC
  Filled 2023-06-19: qty 90, 90d supply, fill #0

## 2023-06-19 MED ORDER — LISINOPRIL 10 MG PO TABS
10.0000 mg | ORAL_TABLET | Freq: Every day | ORAL | 3 refills | Status: AC
  Filled 2023-06-19 (×2): qty 90, 90d supply, fill #0

## 2023-06-19 MED ORDER — LEVOTHYROXINE SODIUM 88 MCG PO TABS
88.0000 ug | ORAL_TABLET | Freq: Every morning | ORAL | 3 refills | Status: AC
Start: 2023-06-19 — End: ?

## 2023-06-19 MED ORDER — PANTOPRAZOLE SODIUM 20 MG PO TBEC
20.0000 mg | DELAYED_RELEASE_TABLET | Freq: Every day | ORAL | 3 refills | Status: AC
  Filled 2023-06-19: qty 30, 30d supply, fill #0
  Filled 2023-07-03: qty 90, 90d supply, fill #0
  Filled 2024-03-28: qty 90, 90d supply, fill #1

## 2023-07-03 ENCOUNTER — Other Ambulatory Visit: Payer: Self-pay

## 2023-07-16 ENCOUNTER — Other Ambulatory Visit: Payer: Self-pay

## 2023-07-17 ENCOUNTER — Other Ambulatory Visit: Payer: Self-pay

## 2023-07-17 MED ORDER — CYCLOBENZAPRINE HCL 10 MG PO TABS
10.0000 mg | ORAL_TABLET | Freq: Two times a day (BID) | ORAL | 0 refills | Status: AC | PRN
  Filled 2023-07-17: qty 30, 15d supply, fill #0

## 2023-07-18 ENCOUNTER — Other Ambulatory Visit: Payer: Self-pay

## 2023-07-19 ENCOUNTER — Other Ambulatory Visit: Payer: Self-pay

## 2023-07-19 MED ORDER — NAPROXEN 500 MG PO TABS
500.0000 mg | ORAL_TABLET | Freq: Every day | ORAL | 0 refills | Status: AC
  Filled 2023-07-19: qty 30, 30d supply, fill #0

## 2023-07-29 ENCOUNTER — Other Ambulatory Visit: Payer: Self-pay

## 2023-07-30 ENCOUNTER — Other Ambulatory Visit: Payer: Self-pay

## 2023-07-30 MED ORDER — MOUNJARO 15 MG/0.5ML ~~LOC~~ SOAJ
15.0000 mg | SUBCUTANEOUS | 3 refills | Status: DC
  Filled 2023-07-30: qty 2, 28d supply, fill #0
  Filled 2023-08-28 – 2023-09-03 (×2): qty 2, 28d supply, fill #1
  Filled 2023-09-23 – 2023-09-26 (×2): qty 2, 28d supply, fill #2
  Filled 2023-10-24: qty 2, 28d supply, fill #3

## 2023-08-20 ENCOUNTER — Other Ambulatory Visit: Payer: Self-pay

## 2023-08-20 MED ORDER — LEVOTHYROXINE SODIUM 88 MCG PO TABS
88.0000 ug | ORAL_TABLET | Freq: Every morning | ORAL | 1 refills | Status: AC
  Filled 2023-08-20: qty 30, 30d supply, fill #0
  Filled 2023-09-23: qty 30, 30d supply, fill #1
  Filled 2023-10-24: qty 30, 30d supply, fill #2
  Filled 2023-11-19: qty 30, 30d supply, fill #3
  Filled 2023-12-18: qty 30, 30d supply, fill #4

## 2023-08-21 ENCOUNTER — Other Ambulatory Visit: Payer: Self-pay

## 2023-08-22 ENCOUNTER — Other Ambulatory Visit: Payer: Self-pay

## 2023-08-22 MED ORDER — LEVOTHYROXINE SODIUM 88 MCG PO TABS
88.0000 ug | ORAL_TABLET | Freq: Every morning | ORAL | 1 refills | Status: AC
  Filled 2023-08-22: qty 30, 30d supply, fill #0

## 2023-08-22 MED ORDER — LEVOTHYROXINE SODIUM 88 MCG PO TABS: 88.0000 ug | ORAL_TABLET | Freq: Every morning | ORAL | 1 refills | Status: DC

## 2023-08-22 MED ORDER — TRAZODONE HCL 50 MG PO TABS
50.0000 mg | ORAL_TABLET | Freq: Every evening | ORAL | 1 refills | Status: AC | PRN
  Filled 2023-08-22: qty 90, 90d supply, fill #0
  Filled 2024-03-28: qty 90, 90d supply, fill #1

## 2023-08-22 MED ORDER — PANTOPRAZOLE SODIUM 20 MG PO TBEC
20.0000 mg | DELAYED_RELEASE_TABLET | Freq: Every day | ORAL | 1 refills | Status: AC
  Filled 2023-08-22: qty 90, 90d supply, fill #0
  Filled 2023-12-30: qty 90, 90d supply, fill #1

## 2023-08-22 MED ORDER — ROSUVASTATIN CALCIUM 20 MG PO TABS
20.0000 mg | ORAL_TABLET | Freq: Every day | ORAL | 1 refills | Status: AC
  Filled 2023-08-22: qty 90, 90d supply, fill #0

## 2023-08-22 MED ORDER — LISINOPRIL 10 MG PO TABS
10.0000 mg | ORAL_TABLET | Freq: Every day | ORAL | 1 refills | Status: DC
  Filled 2023-08-22 – 2023-09-28 (×3): qty 90, 90d supply, fill #0
  Filled 2024-01-08: qty 90, 90d supply, fill #1

## 2023-08-22 MED ORDER — VENLAFAXINE HCL ER 37.5 MG PO CP24
37.5000 mg | ORAL_CAPSULE | Freq: Every day | ORAL | 1 refills | Status: AC
  Filled 2024-03-28: qty 90, 90d supply, fill #0

## 2023-08-28 ENCOUNTER — Other Ambulatory Visit: Payer: Self-pay

## 2023-08-30 ENCOUNTER — Inpatient Hospital Stay
Admission: RE | Admit: 2023-08-30 | Discharge: 2023-08-30 | Disposition: A | Discharge status: Home / Self Care | Payer: Self-pay | Source: Ambulatory Visit | Attending: Physician Assistant | Admitting: Physician Assistant

## 2023-08-30 ENCOUNTER — Other Ambulatory Visit: Payer: Self-pay | Admitting: Family Medicine

## 2023-08-30 DIAGNOSIS — Z049 Encounter for examination and observation for unspecified reason: Secondary | ICD-10-CM

## 2023-08-30 NOTE — Progress Notes (Signed)
 Referring Physician:  Rex Castor, MD [address] [place],  [place] [address]  Primary Physician:  Rex Castor, MD  History of Present Illness: 09/03/2023 [name] is here today with a chief complaint of ongoing pain in her upper mid back region that is stabbing in nature.  It has been ongoing for approximately 8 weeks and is intermittent.  She denies any radiating pain to her ribs.  However she adds that when she moves a certain way it increases her pain, specifically twisting.  The pain is been refractory to ibuprofen and muscle relaxer.  She does feel as though she is having some increased fatigue as well.  She underwent physical therapy to try to help with this pain however it has continued.  She denies any weakness or numbness and tingling.  Conservative measures:  Physical therapy: has participated in PT at [facility]. She was discharged Multimodal medical therapy including regular antiinflammatories: Flexeril , Medrol  Dosepak, Naproxen ,   Injections: no epidural steroid injections  Past Surgery: Lumbar surgery in 2002  [name] has no symptoms of cervical myelopathy.  The symptoms are causing a significant impact on the patient's life.   Review of Systems:  A 10 point review of systems is negative, except for the pertinent positives and negatives detailed in the HPI.  Past Medical History: Past Medical History:  Diagnosis Date   Arthritis    Bronchitis    Diabetes mellitus without complication (HCC)    HBP (high blood pressure)    Headache    migraines   High cholesterol    Hypothyroidism    Sleep apnea    uses cpap   Thyroid condition     Past Surgical History: Past Surgical History:  Procedure Laterality Date   ABDOMINAL HYSTERECTOMY     BACK SURGERY     lumbar   BREAST CYST ASPIRATION Left 03/20/2001   neg   CESAREAN SECTION     COLONOSCOPY     FOOT SURGERY Left    GALLBLADDER SURGERY     TOTAL KNEE ARTHROPLASTY Left  03/01/2021   Procedure: TOTAL KNEE ARTHROPLASTY;  Surgeon: Molli Angelucci, MD;  Location: [facility] ORS;  Service: Orthopedics;  Laterality: Left;    Allergies: Allergies as of 09/03/2023 - Review Complete 09/03/2023  Allergen Reaction Noted   Morphine  Itching 04/28/2023   Tussicaps  [hydrocod poli-chlorphe poli er] Rash 04/28/2023    Medications: Outpatient Encounter Medications as of 09/03/2023  Medication Sig   albuterol  (VENTOLIN  HFA) 108 (90 Base) MCG/ACT inhaler Inhale 1-2 puffs into the lungs every 6 (six) hours as needed for wheezing or shortness of breath.   Blood Glucose Monitoring Suppl (FREESTYLE FREEDOM LITE) w/Device KIT 1 each as directed.   citalopram  (CELEXA ) 20 MG tablet Take 1.5 tablets (30 mg total) by mouth daily.   cyclobenzaprine  (FLEXERIL ) 10 MG tablet Take 1 tablet (10 mg total) by mouth 2 (two) times daily as needed for muscle spasms for up to 15 days.   Dextromethorphan -Guaifenesin  60-1200 MG 12hr tablet Take 1 tablet by mouth 2 (two) times daily as needed.   glucose blood (PRECISION QID TEST) test strip 1 each (1 strip total) once daily Use as instructed.   ipratropium-albuterol  (DUONEB) 0.5-2.5 (3) MG/3ML SOLN Inhale the contents of 1 vial (3 mLs) by nebulization 3 (three) times daily for 10 days   Lancets (FREESTYLE) lancets Use as instructed to check blood sugar twice daily   levothyroxine  (SYNTHROID ) 88 MCG tablet Take 1 tablet (88 mcg total) by  mouth every morning before breakfast (0630) ON EMPTY STOMACH WITH A GLASS OF WATER AT LEAST 30-60 MIN BEFORE BREAKFAST   lisinopril  (ZESTRIL ) 10 MG tablet Take 1 tablet (10 mg total) by mouth daily.   Methylcobalamin 1 MG CHEW Chew by mouth.   methylPREDNISolone  (MEDROL  DOSEPAK) 4 MG TBPK tablet Follow package directions.   naproxen  (NAPROSYN ) 500 MG tablet Take 1 tablet (500 mg total) by mouth daily after breakfast.   pantoprazole  (PROTONIX ) 20 MG tablet Take 1 tablet (20 mg total) by mouth daily.   pantoprazole  (PROTONIX )  20 MG tablet Take 1 tablet (20 mg total) by mouth daily.   promethazine -dextromethorphan  (PROMETHAZINE -DM) 6.25-15 MG/5ML syrup Take 5 mLs by mouth every 6 (six) hours as needed for Cough for up to 24 days   rosuvastatin  (CRESTOR ) 20 MG tablet Take 1 tablet (20 mg total) by mouth daily.   tirzepatide  (MOUNJARO ) 15 MG/0.5ML Pen Inject 15 mg into the skin once a week.   traZODone  (DESYREL ) 50 MG tablet Take 1 tablet (50 mg total) by mouth at bedtime as needed for sleep   venlafaxine  XR (EFFEXOR -XR) 37.5 MG 24 hr capsule Take 1 capsule (37.5 mg total) by mouth daily.   amoxicillin -clavulanate (AUGMENTIN ) 875-125 MG tablet Take 1 tablet by mouth every 12 (twelve) hours for 10 days.   fluconazole  (DIFLUCAN ) 100 MG tablet Take 1 tablet (100 mg total) by mouth daily for 3 days.   levothyroxine  (SYNTHROID ) 88 MCG tablet Take 1 tablet (88 mcg total) by mouth every morning before breakfast (0630) ON EMPTY STOMACH WITH A GLASS OF WATER AT LEAST 30-60 MIN BEFORE BREAKFAST   levothyroxine  (SYNTHROID ) 88 MCG tablet Take 1 tablet (88 mcg total) by mouth every morning before breakfast (0630) ON EMPTY STOMACH WITH A GLASS OF WATER AT LEAST 30-60 MIN BEFORE BREAKFAST   levothyroxine  (SYNTHROID ) 88 MCG tablet Take 1 tablet (88 mcg total) by mouth every morning before breakfast (0630) ON EMPTY STOMACH WITH A GLASS OF WATER AT LEAST 30-60 MIN BEFORE BREAKFAST   levothyroxine  (SYNTHROID ) 88 MCG tablet Take 1 tablet (88 mcg total) by mouth every morning before breakfast (0630) ON EMPTY STOMACH WITH A GLASS OF WATER AT LEAST 30-60 MIN BEFORE BREAKFAST   levothyroxine  (SYNTHROID ) 88 MCG tablet Take 1 tablet (88 mcg total) by mouth every morning before breakfast (0630) ON EMPTY STOMACH WITH A GLASS OF WATER AT LEAST 30-60 MIN BEFORE BREAKFAST   levothyroxine  (SYNTHROID ) 88 MCG tablet Take 1 tablet (88 mcg total) by mouth every morning before breakfast (0630) ON EMPTY STOMACH WITH A GLASS OF WATER AT LEAST 30-60 MIN BEFORE  BREAKFAST   lisinopril  (ZESTRIL ) 10 MG tablet Take 1 tablet (10 mg total) by mouth daily.   lisinopril  (ZESTRIL ) 10 MG tablet Take 1 tablet (10 mg total) by mouth once daily   promethazine -dextromethorphan  (PROMETHAZINE -DM) 6.25-15 MG/5ML syrup Take 5 mLs by mouth every 6 (six) hours as needed for up to 7 days   rosuvastatin  (CRESTOR ) 20 MG tablet Take 1 tablet (20 mg total) by mouth daily.   rosuvastatin  (CRESTOR ) 20 MG tablet Take 1 tablet (20 mg total) by mouth daily.   traZODone  (DESYREL ) 50 MG tablet Take 1 tablet (50 mg total) by mouth at bedtime as needed for sleep.   traZODone  (DESYREL ) 50 MG tablet Take 1 tablet (50 mg total) by mouth at bedtime as needed for Sleep   venlafaxine  XR (EFFEXOR -XR) 37.5 MG 24 hr capsule Take 1 capsule (37.5 mg total) by mouth daily.   venlafaxine   XR (EFFEXOR -XR) 37.5 MG 24 hr capsule Take 1 capsule (37.5 mg total) by mouth daily.   [DISCONTINUED] budesonide  (PULMICORT ) 1 MG/2ML nebulizer solution Inhale the contents of 1 vial (2 mLs (1 mg total)) by nebulization 2 (two) times daily for 10 days.   [DISCONTINUED] furosemide  (LASIX ) 20 MG tablet Take 1 tablet (20 mg total) by mouth once daily for 5 days   No facility-administered encounter medications on file as of 09/03/2023.    Social History: Social History   Tobacco Use   Smoking status: Never   Smokeless tobacco: Never  Substance Use Topics   Alcohol use: Yes    Comment: occasionally   Drug use: No    Family Medical History: Family History  Problem Relation Age of Onset   Breast cancer Maternal Aunt 81    Physical Examination: @VITALWITHPAIN @  General: Patient is well developed, well nourished, calm, collected, and in no apparent distress. Attention to examination is appropriate.  Psychiatric: Patient is non-anxious.  Head:  Pupils equal, round, and reactive to light.  ENT:  Oral mucosa appears well hydrated.  Neck:   Supple.  Full range of motion.  Respiratory: Patient is  breathing without any difficulty.  Extremities: No edema.  Vascular: Palpable dorsal pedal pulses.  Skin:   On exposed skin, there are no abnormal skin lesions.  NEUROLOGICAL:     Awake, alert, oriented to person, place, and time.  Speech is clear and fluent. Fund of knowledge is appropriate.   Cranial Nerves: Pupils equal round and reactive to light.  Facial tone is symmetric.   ROM of spine: Some tenderness to palpation of the distal aspect of her cervical spine and proximal aspect of her thoracic spine.  Lateral to the spinous process on the right side primarily in the paraspinal musculature.  No palpable mass was noted.  Strength: Side Biceps Triceps Deltoid Interossei Grip Wrist Ext. Wrist Flex.  R 5 5 5 5 5 5 5   L 5 5 5 5 5 5 5     Reflexes are 2+ and symmetric at the biceps, triceps, brachioradialis.   Hoffman's is absent.  Bilateral upper  extremity sensation is intact to light touch.    Gait is normal.   No difficulty with tandem gait.   No evidence of dysmetria noted.  Medical Decision Making  Imaging: Previous x-rays of thoracic spine were reviewed which does show some degenerative changes and bone spurring.  I have personally reviewed the images and agree with the above interpretation.  Assessment and Plan: Ms. Chopra is a pleasant 68 y.o. female with is here today with a chief complaint of ongoing pain in her upper mid back region that is stabbing in nature.  It has been ongoing for approximately 8 weeks and is intermittent.  She denies any radiating pain to her ribs.  However she adds that when she moves a certain way it increases her pain, specifically twisting.  The pain is been refractory to ibuprofen and muscle relaxer.  She does feel as though she is having some increased fatigue as well.  She underwent physical therapy to try to help with this pain however it has continued.  She denies any weakness or numbness and tingling.  On examination,Some tenderness to  palpation of the distal aspect of her cervical spine and proximal aspect of her thoracic spine.  Lateral to the spinous process on the right side primarily in the paraspinal musculature.  No palpable mass was noted.  Full strength to bilateral upper  extremities.  Pleasure to see patient in clinic today.  Several days ago patient's pain became very severe.  Again, this has been refractory despite physical therapy and over-the-counter medications for several months.  Would like her to undergo MRI of her thoracic spine as well as x-rays to include her cervical spine with extension and flexion views.  Will review MRI once complete.  Advised patient to contact me for any questions or concerns in the meantime while we await results.    Thank you for involving me in the care of this patient.   I spent a total of 45 minutes in both face-to-face and non-face-to-face activities for this visit on the date of this encounter preparing to see the patient, obtaining and reviewing separately obtained history, performing a medically appropriate examination, counseling the patient, ordering additional test, documenting clinical information.  Ludwig Safer, PA-C Dept. of Neurosurgery

## 2023-09-03 ENCOUNTER — Encounter: Payer: Self-pay | Admitting: Physician Assistant

## 2023-09-03 ENCOUNTER — Ambulatory Visit: Admitting: Physician Assistant

## 2023-09-03 ENCOUNTER — Other Ambulatory Visit: Payer: Self-pay

## 2023-09-03 VITALS — BP 118/74 | Ht 60.0 in | Wt 222.0 lb

## 2023-09-03 DIAGNOSIS — M542 Cervicalgia: Secondary | ICD-10-CM

## 2023-09-03 DIAGNOSIS — M546 Pain in thoracic spine: Secondary | ICD-10-CM | POA: Diagnosis not present

## 2023-09-04 ENCOUNTER — Other Ambulatory Visit: Payer: Self-pay

## 2023-09-07 ENCOUNTER — Encounter: Payer: Self-pay | Admitting: Neurosurgery

## 2023-09-07 ENCOUNTER — Telehealth: Payer: Self-pay

## 2023-09-07 ENCOUNTER — Inpatient Hospital Stay
Admission: RE | Admit: 2023-09-07 | Discharge: 2023-09-07 | Disposition: A | Discharge status: Home / Self Care | Payer: Self-pay | Source: Ambulatory Visit | Attending: Physician Assistant | Admitting: Physician Assistant

## 2023-09-07 ENCOUNTER — Other Ambulatory Visit: Payer: Self-pay

## 2023-09-07 ENCOUNTER — Ambulatory Visit: Admitting: Physician Assistant

## 2023-09-07 DIAGNOSIS — Z049 Encounter for examination and observation for unspecified reason: Secondary | ICD-10-CM

## 2023-09-07 NOTE — Telephone Encounter (Signed)
 She had her cervical spine xrays that you ordered done at [facility]. The images have been loaded to her chart for your review.

## 2023-09-08 ENCOUNTER — Ambulatory Visit
Admission: RE | Admit: 2023-09-08 | Discharge: 2023-09-08 | Disposition: A | Discharge status: Home / Self Care | Source: Ambulatory Visit | Attending: Physician Assistant | Admitting: Physician Assistant

## 2023-09-08 DIAGNOSIS — M546 Pain in thoracic spine: Secondary | ICD-10-CM

## 2023-09-17 ENCOUNTER — Ambulatory Visit: Payer: Self-pay | Admitting: Physician Assistant

## 2023-09-17 ENCOUNTER — Other Ambulatory Visit: Payer: Self-pay | Admitting: Physician Assistant

## 2023-09-17 DIAGNOSIS — M546 Pain in thoracic spine: Secondary | ICD-10-CM

## 2023-09-17 DIAGNOSIS — R9389 Abnormal findings on diagnostic imaging of other specified body structures: Secondary | ICD-10-CM

## 2023-09-23 ENCOUNTER — Other Ambulatory Visit: Payer: Self-pay

## 2023-09-24 ENCOUNTER — Other Ambulatory Visit: Payer: Self-pay

## 2023-09-24 MED ORDER — LISINOPRIL 10 MG PO TABS
10.0000 mg | ORAL_TABLET | Freq: Every day | ORAL | 1 refills | Status: AC
  Filled 2023-09-24: qty 90, 90d supply, fill #0

## 2023-09-25 ENCOUNTER — Ambulatory Visit
Admission: RE | Admit: 2023-09-25 | Discharge: 2023-09-25 | Disposition: A | Discharge status: Home / Self Care | Source: Ambulatory Visit | Attending: Physician Assistant | Admitting: Physician Assistant

## 2023-09-25 DIAGNOSIS — R9389 Abnormal findings on diagnostic imaging of other specified body structures: Secondary | ICD-10-CM

## 2023-09-25 DIAGNOSIS — M546 Pain in thoracic spine: Secondary | ICD-10-CM

## 2023-09-27 ENCOUNTER — Other Ambulatory Visit: Payer: Self-pay

## 2023-09-27 ENCOUNTER — Ambulatory Visit: Payer: Self-pay | Admitting: Physician Assistant

## 2023-09-28 ENCOUNTER — Other Ambulatory Visit: Payer: Self-pay

## 2023-09-28 ENCOUNTER — Other Ambulatory Visit: Payer: Self-pay | Admitting: Physician Assistant

## 2023-09-28 DIAGNOSIS — M546 Pain in thoracic spine: Secondary | ICD-10-CM

## 2023-10-05 ENCOUNTER — Ambulatory Visit
Admission: RE | Admit: 2023-10-05 | Discharge: 2023-10-05 | Disposition: A | Discharge status: Home / Self Care | Source: Ambulatory Visit | Attending: Internal Medicine | Admitting: Internal Medicine

## 2023-10-05 DIAGNOSIS — Z1231 Encounter for screening mammogram for malignant neoplasm of breast: Secondary | ICD-10-CM | POA: Insufficient documentation

## 2023-10-09 ENCOUNTER — Other Ambulatory Visit: Payer: Self-pay

## 2023-10-09 MED ORDER — GABAPENTIN 300 MG PO CAPS
ORAL_CAPSULE | ORAL | 0 refills | Status: AC
  Filled 2023-10-09: qty 24, 12d supply, fill #0

## 2023-10-16 ENCOUNTER — Other Ambulatory Visit: Payer: Self-pay

## 2023-10-16 MED ORDER — CITALOPRAM HYDROBROMIDE 20 MG PO TABS
30.0000 mg | ORAL_TABLET | Freq: Every day | ORAL | 1 refills | Status: DC
  Filled 2023-10-16: qty 135, 90d supply, fill #0
  Filled 2024-01-08: qty 135, 90d supply, fill #1

## 2023-10-24 ENCOUNTER — Other Ambulatory Visit: Payer: Self-pay

## 2023-10-24 MED ORDER — VENLAFAXINE HCL ER 37.5 MG PO CP24
37.5000 mg | ORAL_CAPSULE | Freq: Every day | ORAL | 4 refills | Status: AC
  Filled 2023-10-24: qty 30, 30d supply, fill #0
  Filled 2023-11-19: qty 30, 30d supply, fill #1
  Filled 2023-12-30: qty 30, 30d supply, fill #2
  Filled 2024-01-30: qty 30, 30d supply, fill #3
  Filled 2024-02-25: qty 30, 30d supply, fill #4

## 2023-11-21 ENCOUNTER — Other Ambulatory Visit: Payer: Self-pay

## 2023-11-21 MED ORDER — NITROFURANTOIN MONOHYD MACRO 100 MG PO CAPS
100.0000 mg | ORAL_CAPSULE | Freq: Two times a day (BID) | ORAL | 0 refills | Status: AC
  Filled 2023-11-21: qty 10, 5d supply, fill #0

## 2023-11-21 MED ORDER — MOUNJARO 15 MG/0.5ML ~~LOC~~ SOAJ
15.0000 mg | SUBCUTANEOUS | 3 refills | Status: DC
  Filled 2023-11-21: qty 2, 28d supply, fill #0
  Filled 2023-12-18: qty 2, 28d supply, fill #1
  Filled 2024-01-16: qty 2, 28d supply, fill #2
  Filled 2024-02-13: qty 2, 28d supply, fill #3

## 2023-11-26 ENCOUNTER — Other Ambulatory Visit: Payer: Self-pay

## 2023-11-26 MED ORDER — PAXLOVID (150/100) 10 X 150 MG & 10 X 100MG PO TBPK
ORAL_TABLET | ORAL | 0 refills | Status: AC
  Filled 2023-11-26: qty 20, 5d supply, fill #0

## 2023-11-26 MED ORDER — PROMETHAZINE-DM 6.25-15 MG/5ML PO SYRP
5.0000 mL | ORAL_SOLUTION | Freq: Four times a day (QID) | ORAL | 0 refills | Status: DC | PRN
  Filled 2023-11-26: qty 120, 6d supply, fill #0

## 2023-11-26 MED ORDER — PREDNISONE 10 MG PO TABS
10.0000 mg | ORAL_TABLET | Freq: Every day | ORAL | 0 refills | Status: AC
  Filled 2023-11-26: qty 5, 5d supply, fill #0

## 2023-11-26 MED ORDER — DOXYCYCLINE HYCLATE 100 MG PO CAPS
100.0000 mg | ORAL_CAPSULE | Freq: Two times a day (BID) | ORAL | 0 refills | Status: AC
  Filled 2023-11-26: qty 14, 7d supply, fill #0

## 2023-12-24 ENCOUNTER — Other Ambulatory Visit: Payer: Self-pay

## 2023-12-24 MED ORDER — VITAMIN D (ERGOCALCIFEROL) 1.25 MG (50000 UNIT) PO CAPS
50000.0000 [IU] | ORAL_CAPSULE | ORAL | 1 refills | Status: AC
  Filled 2023-12-24: qty 12, 84d supply, fill #0

## 2023-12-24 MED ORDER — LEVOTHYROXINE SODIUM 100 MCG PO TABS
100.0000 ug | ORAL_TABLET | Freq: Every day | ORAL | 1 refills | Status: AC
  Filled 2023-12-24: qty 90, 90d supply, fill #0
  Filled 2024-03-11: qty 90, 90d supply, fill #1

## 2023-12-31 ENCOUNTER — Other Ambulatory Visit: Payer: Self-pay

## 2024-01-11 ENCOUNTER — Other Ambulatory Visit: Payer: Self-pay

## 2024-01-11 MED ORDER — PREDNISONE 10 MG PO TABS
10.0000 mg | ORAL_TABLET | Freq: Every day | ORAL | 0 refills | Status: AC
  Filled 2024-01-11: qty 5, 5d supply, fill #0

## 2024-01-11 MED ORDER — AMOXICILLIN-POT CLAVULANATE 875-125 MG PO TABS
875.0000 mg | ORAL_TABLET | Freq: Two times a day (BID) | ORAL | 0 refills | Status: AC
  Filled 2024-01-11: qty 14, 7d supply, fill #0

## 2024-01-11 MED ORDER — ALBUTEROL SULFATE HFA 108 (90 BASE) MCG/ACT IN AERS
2.0000 | INHALATION_SPRAY | Freq: Four times a day (QID) | RESPIRATORY_TRACT | 0 refills | Status: AC | PRN
  Filled 2024-01-11: qty 6.7, 25d supply, fill #0

## 2024-01-11 MED ORDER — PROMETHAZINE-DM 6.25-15 MG/5ML PO SYRP
5.0000 mL | ORAL_SOLUTION | Freq: Four times a day (QID) | ORAL | 0 refills | Status: AC | PRN
  Filled 2024-01-11: qty 120, 6d supply, fill #0

## 2024-02-13 ENCOUNTER — Other Ambulatory Visit: Payer: Self-pay

## 2024-03-11 ENCOUNTER — Other Ambulatory Visit: Payer: Self-pay

## 2024-03-12 ENCOUNTER — Other Ambulatory Visit: Payer: Self-pay

## 2024-03-12 MED ORDER — MOUNJARO 15 MG/0.5ML ~~LOC~~ SOAJ
15.0000 mg | SUBCUTANEOUS | 3 refills | Status: AC
  Filled 2024-03-12: qty 2, 28d supply, fill #0
  Filled 2024-04-06: qty 2, 28d supply, fill #1

## 2024-03-14 ENCOUNTER — Other Ambulatory Visit: Payer: Self-pay

## 2024-03-17 ENCOUNTER — Other Ambulatory Visit: Payer: Self-pay

## 2024-03-17 ENCOUNTER — Other Ambulatory Visit (HOSPITAL_COMMUNITY): Payer: Self-pay

## 2024-03-19 ENCOUNTER — Other Ambulatory Visit: Payer: Self-pay

## 2024-03-28 ENCOUNTER — Other Ambulatory Visit: Payer: Self-pay

## 2024-03-31 ENCOUNTER — Other Ambulatory Visit: Payer: Self-pay

## 2024-04-06 ENCOUNTER — Other Ambulatory Visit: Payer: Self-pay

## 2024-04-07 ENCOUNTER — Other Ambulatory Visit: Payer: Self-pay

## 2024-04-07 MED ORDER — CITALOPRAM HYDROBROMIDE 20 MG PO TABS
30.0000 mg | ORAL_TABLET | Freq: Every day | ORAL | 1 refills | Status: AC
  Filled 2024-04-07: qty 135, 90d supply, fill #0

## 2024-04-07 MED ORDER — LISINOPRIL 10 MG PO TABS
10.0000 mg | ORAL_TABLET | Freq: Every day | ORAL | 1 refills | Status: AC
  Filled 2024-04-07: qty 90, 90d supply, fill #0

## 2024-04-08 ENCOUNTER — Other Ambulatory Visit: Payer: Self-pay

## 2024-04-08 MED ORDER — TERBINAFINE HCL 250 MG PO TABS
250.0000 mg | ORAL_TABLET | Freq: Every day | ORAL | 2 refills | Status: AC
  Filled 2024-04-08: qty 30, 30d supply, fill #0
  Filled 2024-04-24: qty 30, 30d supply, fill #1

## 2024-04-24 ENCOUNTER — Other Ambulatory Visit: Payer: Self-pay
